# Patient Record
Sex: Female | Born: 1985
Health system: Southern US, Community
[De-identification: ages and names within clinical notes are randomized; demographics above are authoritative.]

## PROBLEM LIST (undated history)

## (undated) DIAGNOSIS — N840 Polyp of corpus uteri: Secondary | ICD-10-CM

## (undated) DIAGNOSIS — O24419 Gestational diabetes mellitus in pregnancy, unspecified control: Secondary | ICD-10-CM

## (undated) DIAGNOSIS — N83201 Unspecified ovarian cyst, right side: Secondary | ICD-10-CM

## (undated) HISTORY — DX: Unspecified ovarian cyst, right side: N83.201

## (undated) HISTORY — DX: Gestational diabetes mellitus in pregnancy, unspecified control: O24.419

---

## 2014-07-18 HISTORY — PX: UTERINE FIBROID SURGERY: SHX826

## 2014-07-18 HISTORY — PX: CYSTECTOMY: SUR359

## 2015-04-13 ENCOUNTER — Encounter: Payer: Self-pay | Admitting: Family Medicine

## 2015-04-13 ENCOUNTER — Ambulatory Visit (INDEPENDENT_AMBULATORY_CARE_PROVIDER_SITE_OTHER): Payer: No Typology Code available for payment source | Admitting: Family Medicine

## 2015-04-13 VITALS — BP 124/81 | HR 63 | Temp 98.2°F | Resp 16 | Ht 64.0 in | Wt 111.0 lb

## 2015-04-13 DIAGNOSIS — Z0000 Encounter for general adult medical examination without abnormal findings: Secondary | ICD-10-CM

## 2015-04-13 DIAGNOSIS — Z7189 Other specified counseling: Secondary | ICD-10-CM

## 2015-04-13 DIAGNOSIS — Z7689 Persons encountering health services in other specified circumstances: Secondary | ICD-10-CM

## 2015-04-13 DIAGNOSIS — R0789 Other chest pain: Secondary | ICD-10-CM | POA: Insufficient documentation

## 2015-04-13 LAB — COMPLETE METABOLIC PANEL WITH GFR
ALT: 16 U/L (ref 6–29)
AST: 22 U/L (ref 10–30)
Albumin: 4.4 g/dL (ref 3.6–5.1)
Alkaline Phosphatase: 37 U/L (ref 33–115)
BILIRUBIN TOTAL: 1.1 mg/dL (ref 0.2–1.2)
BUN: 13 mg/dL (ref 7–25)
CHLORIDE: 107 mmol/L (ref 98–110)
CO2: 27 mmol/L (ref 20–31)
CREATININE: 0.75 mg/dL (ref 0.50–1.10)
Calcium: 9.3 mg/dL (ref 8.6–10.2)
GFR, Est Non African American: >89 mL/min (ref >=60)
GLUCOSE: 93 mg/dL (ref 65–99)
Potassium: 3.7 mmol/L (ref 3.5–5.3)
SODIUM: 139 mmol/L (ref 135–146)
TOTAL PROTEIN: 7 g/dL (ref 6.1–8.1)

## 2015-04-13 LAB — CBC WITH DIFFERENTIAL/PLATELET
BASOS ABS: 0 10*3/uL (ref 0.0–0.1)
Basophils Relative: 0 % (ref 0–1)
EOS ABS: 0 10*3/uL (ref 0.0–0.7)
EOS PCT: 1 % (ref 0–5)
HEMATOCRIT: 38.5 % (ref 36.0–46.0)
Hemoglobin: 12.8 g/dL (ref 12.0–15.0)
LYMPHS ABS: 1.8 10*3/uL (ref 0.7–4.0)
LYMPHS PCT: 42 % (ref 12–46)
MCH: 31.3 pg (ref 26.0–34.0)
MCHC: 33.2 g/dL (ref 30.0–36.0)
MCV: 94.1 fL (ref 78.0–100.0)
MONOS PCT: 10 % (ref 3–12)
MPV: 10.5 fL (ref 8.6–12.4)
Monocytes Absolute: 0.4 10*3/uL (ref 0.1–1.0)
Neutro Abs: 2 10*3/uL (ref 1.7–7.7)
Neutrophils Relative %: 47 % (ref 43–77)
Platelets: 180 10*3/uL (ref 150–400)
RBC: 4.09 MIL/uL (ref 3.87–5.11)
RDW: 13.1 % (ref 11.5–15.5)
WBC: 4.3 10*3/uL (ref 4.0–10.5)

## 2015-04-13 NOTE — Patient Instructions (Signed)
We will call you if there is anything in bloodwork that needs attention. Come back in 3 months. Can take ibupropen for the rib care pain.

## 2015-04-13 NOTE — Progress Notes (Signed)
Patient ID: Kara Hull, female   DOB: 05-14-1986, 29 y.o.   MRN: [id number]   Kara Hull, is a 29 y.o. female  [id number]  [id number]  DOB - [birth year]  CC:  Chief Complaint  Patient presents with  . Establish Care    pain in right side.       HPI: Kara Hull is a 29 y.o. female here to establish care. Patient and husband are recently from [place].  Information is obtained with difficulty even with the help of an interpreter. The patient denies any chronic health problems other than an gynecological issue. She has had surgery for a chocolate cyst and a hysterescopy related to inablility to conceive. I am unclear about results of that procedure.  Her only other compliant is of pain and soreness in the area of the left breast. When she points to the painful area, it is below the breast over the rib cage anteriorly and laterally. She is no aware of an injury. It is unclear how long it has been bothering her.  She takes no medications on a regular basis.  No Known Allergies History reviewed. No pertinent past medical history. No current outpatient prescriptions on file prior to visit.   No current facility-administered medications on file prior to visit.   Family History  Problem Relation Age of Onset  . Diabetes Mother   . Cancer Maternal Grandmother    Social History   Social History  . Marital Status: Married    Spouse Name: N/A  . Number of Children: N/A  . Years of Education: N/A   Occupational History  . Not on file.   Social History Main Topics  . Smoking status: Never Smoker   . Smokeless tobacco: Never Used  . Alcohol Use: No  . Drug Use: No  . Sexual Activity: Not on file   Other Topics Concern  . Not on file   Social History Narrative  . No narrative on file    Review of Systems: Constitutional: Negative for fever, chills, appetite change, weight loss,  Fatigue. Skin: Negative for rashes or lesions of concern. HENT: Negative for ear pain, ear  discharge.nose bleeds Eyes: Negative for pain, discharge, redness, itching and visual disturbance.Wears glasses for vsion. Neck: Negative for pain, stiffness Respiratory: Negative for cough, shortness of breath,   Cardiovascular: Negative for chest pain, palpitations and leg swelling. Gastrointestinal: Negative for abdominal pain, nausea, vomiting, diarrhea, constipations Genitourinary: Negative for dysuria, urgency, frequency, hematuria,  Musculoskeletal: Negative for back pain, joint pain, joint  swelling, and gait problem.Negative for weakness. Neurological: Negative for tremors, seizures, syncope,   light-headedness, numbness and headaches. Positive for occassional dizziness on rising.  GYN: Reports regular menstural periods. No information available as to last PAP. Hematological: Negative for easy bruising or bleeding Psychiatric/Behavioral: Negative for depression, anxiety, decreased concentration, confusion   Objective:   Filed Vitals:   04/13/15 1004  BP: 124/81  Pulse: 63  Temp: 98.2 F (36.8 C)  Resp: 16    Physical Exam: Constitutional: Patient appears well-developed and well-nourished. No distress. HENT: Normocephalic, atraumatic, External right and left ear normal. Oropharynx is clear and moist.  Eyes: Conjunctivae and EOM are normal. PERRLA, no scleral icterus. Neck: Normal ROM. Neck supple. No lymphadenopathy, No thyromegaly. CVS: RRR, S1/S2 +, no murmurs, no gallops, no rubs Pulmonary: Effort and breath sounds normal, no stridor, rhonchi, wheezes, rales.  Abdominal: Soft. Normoactive BS,, no distension, tenderness, rebound or guarding.  Musculoskeletal: Normal range of motion. No edema. There is  tenderness over the rib cage under the left breast and around laterally at the same level.  Neuro: Alert.Normal muscle tone coordination. Non-focal Skin: Skin is warm and dry. No rash noted. Not diaphoretic. No erythema. No pallor. Psychiatric: Normal mood and affect.  Behavior, judgment, thought content normal.  No results found for: WBC, HGB, HCT, MCV, PLT No results found for: CREATININE, BUN, NA, K, CL, CO2  No results found for: HGBA1C Lipid Panel  No results found for: CHOL, TRIG, HDL, CHOLHDL, VLDL, LDLCALC     Assessment and plan:   1. Health care maintenance  - COMPLETE METABOLIC PANEL WITH GFR - CBC with Differential  2. Encounter to establish care - With help of an interpreter, I have reviewed information provided by the patient and her husband  3.Infertility -Will attempt referral.   Return in about 3 months (around 07/13/2015).  The patient was given clear instructions to go to ER or return to medical center if symptoms don't improve, worsen or new problems develop. The patient verbalized understanding.    Henrietta Hoover FNP  04/13/2015, 11:15 AM

## 2015-07-15 ENCOUNTER — Ambulatory Visit (INDEPENDENT_AMBULATORY_CARE_PROVIDER_SITE_OTHER): Payer: Self-pay | Admitting: Family Medicine

## 2015-07-15 ENCOUNTER — Encounter: Payer: Self-pay | Admitting: Family Medicine

## 2015-07-15 ENCOUNTER — Other Ambulatory Visit (HOSPITAL_COMMUNITY)
Admission: RE | Admit: 2015-07-15 | Discharge: 2015-07-15 | Disposition: A | Discharge status: Home / Self Care | Payer: Self-pay | Source: Ambulatory Visit | Attending: Internal Medicine | Admitting: Internal Medicine

## 2015-07-15 VITALS — BP 111/67 | HR 75 | Temp 98.0°F | Resp 16 | Ht 64.0 in | Wt 109.0 lb

## 2015-07-15 DIAGNOSIS — A499 Bacterial infection, unspecified: Secondary | ICD-10-CM

## 2015-07-15 DIAGNOSIS — R10A1 Flank pain, right side: Secondary | ICD-10-CM | POA: Insufficient documentation

## 2015-07-15 DIAGNOSIS — N941 Unspecified dyspareunia: Secondary | ICD-10-CM

## 2015-07-15 DIAGNOSIS — N979 Female infertility, unspecified: Secondary | ICD-10-CM

## 2015-07-15 DIAGNOSIS — Z789 Other specified health status: Secondary | ICD-10-CM

## 2015-07-15 DIAGNOSIS — N760 Acute vaginitis: Secondary | ICD-10-CM

## 2015-07-15 DIAGNOSIS — N898 Other specified noninflammatory disorders of vagina: Secondary | ICD-10-CM

## 2015-07-15 DIAGNOSIS — N83201 Unspecified ovarian cyst, right side: Secondary | ICD-10-CM

## 2015-07-15 DIAGNOSIS — B9689 Other specified bacterial agents as the cause of diseases classified elsewhere: Secondary | ICD-10-CM

## 2015-07-15 DIAGNOSIS — R109 Unspecified abdominal pain: Secondary | ICD-10-CM

## 2015-07-15 DIAGNOSIS — Z01419 Encounter for gynecological examination (general) (routine) without abnormal findings: Secondary | ICD-10-CM

## 2015-07-15 LAB — COMPLETE METABOLIC PANEL WITH GFR
ALBUMIN: 4.4 g/dL (ref 3.6–5.1)
ALK PHOS: 42 U/L (ref 33–115)
ALT: 13 U/L (ref 6–29)
AST: 18 U/L (ref 10–30)
BUN: 8 mg/dL (ref 7–25)
CALCIUM: 9.2 mg/dL (ref 8.6–10.2)
CHLORIDE: 105 mmol/L (ref 98–110)
CO2: 23 mmol/L (ref 20–31)
CREATININE: 0.65 mg/dL (ref 0.50–1.10)
GFR, Est Non African American: >89 mL/min (ref >=60)
Glucose, Bld: 91 mg/dL (ref 65–99)
POTASSIUM: 3.9 mmol/L (ref 3.5–5.3)
Sodium: 138 mmol/L (ref 135–146)
Total Bilirubin: 0.8 mg/dL (ref 0.2–1.2)
Total Protein: 7.2 g/dL (ref 6.1–8.1)

## 2015-07-15 LAB — POCT URINALYSIS DIP (DEVICE)
Bilirubin Urine: NEGATIVE
GLUCOSE, UA: NEGATIVE mg/dL
Hgb urine dipstick: NEGATIVE
KETONES UR: NEGATIVE mg/dL
Leukocytes, UA: NEGATIVE
NITRITE: NEGATIVE
PROTEIN: NEGATIVE mg/dL
Specific Gravity, Urine: 1.02 (ref 1.005–1.030)
Urobilinogen, UA: 0.2 mg/dL (ref 0.0–1.0)
pH: 7 (ref 5.0–8.0)

## 2015-07-15 LAB — CBC WITH DIFFERENTIAL/PLATELET
Basophils Absolute: 0 10*3/uL (ref 0.0–0.1)
Basophils Relative: 0 % (ref 0–1)
Eosinophils Absolute: 0.1 10*3/uL (ref 0.0–0.7)
Eosinophils Relative: 1 % (ref 0–5)
HEMATOCRIT: 39.6 % (ref 36.0–46.0)
Hemoglobin: 13.3 g/dL (ref 12.0–15.0)
LYMPHS ABS: 1.7 10*3/uL (ref 0.7–4.0)
LYMPHS PCT: 31 % (ref 12–46)
MCH: 31.6 pg (ref 26.0–34.0)
MCHC: 33.6 g/dL (ref 30.0–36.0)
MCV: 94.1 fL (ref 78.0–100.0)
MONO ABS: 0.4 10*3/uL (ref 0.1–1.0)
MPV: 10.6 fL (ref 8.6–12.4)
Monocytes Relative: 8 % (ref 3–12)
Neutro Abs: 3.2 10*3/uL (ref 1.7–7.7)
Neutrophils Relative %: 60 % (ref 43–77)
Platelets: 203 10*3/uL (ref 150–400)
RBC: 4.21 MIL/uL (ref 3.87–5.11)
RDW: 13.1 % (ref 11.5–15.5)
WBC: 5.4 10*3/uL (ref 4.0–10.5)

## 2015-07-15 LAB — POCT URINE PREGNANCY: PREG TEST UR: NEGATIVE

## 2015-07-15 MED ORDER — METRONIDAZOLE 500 MG PO TABS: 500.0000 mg | ORAL_TABLET | Freq: Two times a day (BID) | ORAL | Status: DC

## 2015-07-15 NOTE — Progress Notes (Signed)
Subjective:    Patient ID: Kara Hull, female    DOB: May 03, 1986, 29 y.o.   MRN: [id number]  HPI Kara Hull, a 29 year old female with a history of a right ovarian cyst and right cystectomy presents complaining of right flank pain. Patient is accompanied by husband and interpreter, she primarily speaks Mandarin. She recently relocated from [place].  Patient states that she has been experiencing right flank pain and occasional dyspareunia with sexual intercourse. She states that she did not receive follow up after cystectomy, which was performed in [place]. She currently denies fever, fatigue, dysuria, vaginal discharge, abnormal vaginal bleeding, hematuria, nausea, vomiting, or diarrhea. She has not attempted any OTC interventions for right flank pain.   She also maintains that she and her spouse have been trying to conceive for the past 6-8 months without success. She has normal menstrual periods. She has not had a fertility workup. She states that she has not been followed by a gynecologist due to lack of payer source.     Past Medical History  Diagnosis Date  . Right ovarian cyst    Past Surgical History  Procedure Laterality Date  . Cyst    . Cystectomy Right   No Known Allergies   There is no immunization history on file for this patient.  Social History   Social History  . Marital Status: Married    Spouse Name: N/A  . Number of Children: N/A  . Years of Education: N/A   Occupational History  . Not on file.   Social History Main Topics  . Smoking status: Never Smoker   . Smokeless tobacco: Never Used  . Alcohol Use: No  . Drug Use: No  . Sexual Activity: Not on file   Other Topics Concern  . Not on file   Social History Narrative   Review of Systems  Constitutional: Negative.   HENT: Negative.   Eyes: Negative.   Gastrointestinal: Positive for abdominal pain.  Endocrine: Negative.   Genitourinary: Positive for dysuria, vaginal discharge, vaginal pain and  dyspareunia. Negative for hematuria.  Musculoskeletal: Negative.   Skin: Negative.   Allergic/Immunologic: Negative.   Neurological: Negative.   Hematological: Negative.   Psychiatric/Behavioral: Negative.        Objective:   Physical Exam  Constitutional: She is oriented to person, place, and time. She appears well-developed and well-nourished.  HENT:  Head: Normocephalic and atraumatic.  Right Ear: External ear normal.  Left Ear: External ear normal.  Mouth/Throat: Oropharynx is clear and moist.  Eyes: Conjunctivae and EOM are normal. Pupils are equal, round, and reactive to light.  Neck: Normal range of motion. Neck supple.  Cardiovascular: Normal rate, regular rhythm, normal heart sounds and intact distal pulses.   Pulmonary/Chest: Effort normal and breath sounds normal.  Abdominal: Soft. Bowel sounds are normal. Hernia confirmed negative in the right inguinal area and confirmed negative in the left inguinal area.  Genitourinary: Uterus normal. There is no rash or tenderness on the right labia. There is no rash or tenderness on the left labia. Cervix exhibits motion tenderness, discharge and friability. Right adnexum displays no mass, no tenderness and no fullness. Left adnexum displays no mass, no tenderness and no fullness. Vaginal discharge found.  Musculoskeletal: Normal range of motion.  Lymphadenopathy:       Right: No inguinal adenopathy present.       Left: No inguinal adenopathy present.  Neurological: She is alert and oriented to person, place, and time. She has  normal reflexes.  Skin: Skin is warm and dry.  Psychiatric: She has a normal mood and affect. Her behavior is normal. Judgment and thought content normal.         BP 111/67 mmHg  Pulse 75  Temp(Src) 98 F (36.7 C) (Oral)  Resp 16  Ht  (1.626 m)  Wt 109 lb (49.442 kg)  BMI 18.70 kg/m2  LMP 06/15/2015 Assessment & Plan:  1. Unspecified ovarian cyst, right side Patient reports a history of a  right ovarian cyst. She states that she had surgery while living in [place]. Will order pelvic/transvaginal ultrasound. Patient may warrant referral to gynecology for further evaluation.   2. Right flank pain  - POCT urinalysis dipstick (urinalysis unremarkable) - CBC with Differential - COMPLETE METABOLIC PANEL WITH GFR - US Pelvis Complete; Future - US Transvaginal Non-OB; Future - POCT urine pregnancy (negative)  3. Dyspareunia, female Refer to #2  4. Bacterial vaginosis 3-5 clue cells per hight power field. - metroNIDAZOLE (FLAGYL) 500 MG tablet; Take 1 tablet (500 mg total) by mouth 2 (two) times daily.  Dispense: 14 tablet; Refill: 0 -Wetprep/Koh prep 5. Vaginal discharge Copious vaginal discharge on gynecological exam. Many clue cells present on wet prep.   6. Encounter for routine gynecological examination  - Cytology - PAP [place]  7. Language barrier to communication Patient primarily speaks Mandarin, utilized interpreter to assist with communication.   8. Infertility, female Patient has been trying to conceive for greater than 6 months. Patient warrants a gynecology referral for further evaluation, she is currently unable to afford due to lack of payer source.   RTC: Will follow-up with patient by phone following review of ultrasound results.   Massie Maroon, FNP

## 2015-07-15 NOTE — Patient Instructions (Signed)
Bacterial Vaginosis Bacterial vaginosis is a vaginal infection that occurs when the normal balance of bacteria in the vagina is disrupted. It results from an overgrowth of certain bacteria. This is the most common vaginal infection in women of childbearing age. Treatment is important to prevent complications, especially in pregnant women, as it can cause a premature delivery. CAUSES  Bacterial vaginosis is caused by an increase in harmful bacteria that are normally present in smaller amounts in the vagina. Several different kinds of bacteria can cause bacterial vaginosis. However, the reason that the condition develops is not fully understood. RISK FACTORS Certain activities or behaviors can put you at an increased risk of developing bacterial vaginosis, including:  Having a new sex partner or multiple sex partners.  Douching.  Using an intrauterine device (IUD) for contraception. Women do not get bacterial vaginosis from toilet seats, bedding, swimming pools, or contact with objects around them. SIGNS AND SYMPTOMS  Some women with bacterial vaginosis have no signs or symptoms. Common symptoms include:  Grey vaginal discharge.  A fishlike odor with discharge, especially after sexual intercourse.  Itching or burning of the vagina and vulva.  Burning or pain with urination. DIAGNOSIS  Your health care provider will take a medical history and examine the vagina for signs of bacterial vaginosis. A sample of vaginal fluid may be taken. Your health care provider will look at this sample under a microscope to check for bacteria and abnormal cells. A vaginal pH test may also be done.  TREATMENT  Bacterial vaginosis may be treated with antibiotic medicines. These may be given in the form of a pill or a vaginal cream. A second round of antibiotics may be prescribed if the condition comes back after treatment. Because bacterial vaginosis increases your risk for sexually transmitted diseases, getting  treated can help reduce your risk for chlamydia, gonorrhea, HIV, and herpes. HOME CARE INSTRUCTIONS   Only take over-the-counter or prescription medicines as directed by your health care provider.  If antibiotic medicine was prescribed, take it as directed. Make sure you finish it even if you start to feel better.  Tell all sexual partners that you have a vaginal infection. They should see their health care provider and be treated if they have problems, such as a mild rash or itching.  During treatment, it is important that you follow these instructions:  Avoid sexual activity or use condoms correctly.  Do not douche.  Avoid alcohol as directed by your health care provider.  Avoid breastfeeding as directed by your health care provider. SEEK MEDICAL CARE IF:   Your symptoms are not improving after 3 days of treatment.  You have increased discharge or pain.  You have a fever. MAKE SURE YOU:   Understand these instructions.  Will watch your condition.  Will get help right away if you are not doing well or get worse. FOR MORE INFORMATION  Centers for Disease Control and Prevention, Division of STD Prevention: SolutionApps.co.za American Sexual Health Association (ASHA): www.ashastd.org    This information is not intended to replace advice given to you by your health care provider. Make sure you discuss any questions you have with your health care provider.   Document Released: 07/04/2005 Document Revised: 07/25/2014 Document Reviewed: 02/13/2013 Elsevier Interactive Patient Education 2016 Elsevier Inc. Abdominal or Pelvic Ultrasound Ultrasound uses harmless sound waves instead of X-rays to take pictures of the inside of your body. A probe or wand device (transducer) is held up against your body to capture these pictures.  The continually changing images can be recorded on videotape or film. Diagnostic ultrasound imaging is commonly called sonography or ultrasonography. There are  different types of ultrasound exams. An ultrasound of the gallbladder, liver, and pancreas can show gallstones, masses, cysts, inflammation, infection, or enlarged organs. An ultrasound of the kidneys can show cysts, masses, kidney stones, and kidney size and shape. A pelvic ultrasound can show the uterus, ovaries, and cysts or masses. An obstetrical ultrasound shows the position of the fetus, measurements for maturity, fetal heartbeat, and fetal organs. A breast, thyroid, or testicular ultrasound can show if a nodule is solid or cystic. RISKS AND COMPLICATIONS Ultrasound has been used for many years and has never shown any harmful effects. Studies in humans have shown no direct link between the use of ultrasound and adverse outcomes. BEFORE THE PROCEDURE Other than drinking water, do not eat or drink for 8 to 12 hours before the test or as directed by your caregiver. Follow any other diet instructions from your caregiver. If you are having a pelvic ultrasound, you may need to drink a lot of liquid before the exam. A full bladder helps to see the organs behind the bladder better. PROCEDURE  There is no pain in an ultrasound exam. A gel is applied to your skin, and the transducer is then placed on the area to be examined. The gel may feel cool. The gel wipes off easily, but it is a good idea to wear clothing that is easily washable. The images from inside your body are displayed on one or more monitors that look like small television screens. The returning sound waves produce pictures of the organs that were in the path of the sound sent from the transducer. The room is usually darkened during the exam. This makes it easier to see the images on the monitor. The ultrasound exam should take less than 1 hour. AFTER THE PROCEDURE You can safely drive home and return to regular activities immediately after the exam. Ask when your test results will be ready. Make sure you get your test results.   This  information is not intended to replace advice given to you by your health care provider. Make sure you discuss any questions you have with your health care provider.   Document Released: 07/01/2000 Document Revised: 09/26/2011 Document Reviewed: 12/23/2010 Elsevier Interactive Patient Education [employer] Elsevier Inc.

## 2015-07-16 ENCOUNTER — Ambulatory Visit: Payer: Self-pay | Admitting: Family Medicine

## 2015-07-17 LAB — CYTOLOGY - PAP

## 2015-07-22 ENCOUNTER — Ambulatory Visit (HOSPITAL_COMMUNITY): Payer: Self-pay

## 2015-08-04 ENCOUNTER — Ambulatory Visit (HOSPITAL_COMMUNITY)
Admission: RE | Admit: 2015-08-04 | Discharge: 2015-08-04 | Disposition: A | Discharge status: Home / Self Care | Payer: Self-pay | Source: Ambulatory Visit | Attending: Family Medicine | Admitting: Family Medicine

## 2015-08-04 DIAGNOSIS — R109 Unspecified abdominal pain: Secondary | ICD-10-CM | POA: Insufficient documentation

## 2015-08-04 DIAGNOSIS — N838 Other noninflammatory disorders of ovary, fallopian tube and broad ligament: Secondary | ICD-10-CM | POA: Insufficient documentation

## 2015-08-04 DIAGNOSIS — R102 Pelvic and perineal pain: Secondary | ICD-10-CM | POA: Insufficient documentation

## 2015-08-05 ENCOUNTER — Telehealth: Payer: Self-pay | Admitting: Family Medicine

## 2015-08-05 DIAGNOSIS — N83202 Unspecified ovarian cyst, left side: Principal | ICD-10-CM

## 2015-08-05 DIAGNOSIS — R9389 Abnormal findings on diagnostic imaging of other specified body structures: Secondary | ICD-10-CM

## 2015-08-05 DIAGNOSIS — N83201 Unspecified ovarian cyst, right side: Secondary | ICD-10-CM

## 2015-08-05 DIAGNOSIS — N83209 Unspecified ovarian cyst, unspecified side: Secondary | ICD-10-CM | POA: Insufficient documentation

## 2015-08-05 NOTE — Telephone Encounter (Signed)
Called patient using Interpreter ID # [phone] explained to patient results from ultrasound and that she will need to come in for pregnancy test and will need another ultrasound in 6 weeks. Patient verbalized understanding and states will come in today or tomorrow. Thanks!

## 2015-08-05 NOTE — Telephone Encounter (Signed)
Reviewed pelvic/transvaginal ultrasound. Possible hemorrhagic cyst noted in left ovary. Will repeat ultrasound in 6 weeks. Will also repeat pregnancy test to rule out ectopic pregnancy.   Massie Maroon, FNP

## 2015-12-29 ENCOUNTER — Encounter: Payer: Self-pay | Admitting: Pulmonary Disease

## 2015-12-29 ENCOUNTER — Observation Stay (HOSPITAL_COMMUNITY)
Admission: AD | Admit: 2015-12-29 | Discharge: 2015-12-31 | Disposition: A | Discharge status: Home / Self Care | Payer: Self-pay | Source: Ambulatory Visit | Attending: Internal Medicine | Admitting: Internal Medicine

## 2015-12-29 ENCOUNTER — Encounter: Payer: Self-pay | Admitting: Student

## 2015-12-29 ENCOUNTER — Ambulatory Visit (INDEPENDENT_AMBULATORY_CARE_PROVIDER_SITE_OTHER): Payer: Self-pay | Admitting: Pulmonary Disease

## 2015-12-29 ENCOUNTER — Observation Stay (HOSPITAL_COMMUNITY): Payer: Self-pay

## 2015-12-29 VITALS — BP 99/68 | HR 111 | Ht 63.0 in | Wt 119.8 lb

## 2015-12-29 DIAGNOSIS — R748 Abnormal levels of other serum enzymes: Secondary | ICD-10-CM | POA: Diagnosis present

## 2015-12-29 DIAGNOSIS — D696 Thrombocytopenia, unspecified: Secondary | ICD-10-CM | POA: Diagnosis present

## 2015-12-29 DIAGNOSIS — B349 Viral infection, unspecified: Secondary | ICD-10-CM | POA: Insufficient documentation

## 2015-12-29 DIAGNOSIS — R7401 Elevation of levels of liver transaminase levels: Secondary | ICD-10-CM

## 2015-12-29 DIAGNOSIS — R509 Fever, unspecified: Secondary | ICD-10-CM | POA: Diagnosis present

## 2015-12-29 DIAGNOSIS — R059 Cough, unspecified: Secondary | ICD-10-CM

## 2015-12-29 DIAGNOSIS — R823 Hemoglobinuria: Secondary | ICD-10-CM | POA: Insufficient documentation

## 2015-12-29 DIAGNOSIS — R945 Abnormal results of liver function studies: Secondary | ICD-10-CM

## 2015-12-29 DIAGNOSIS — R112 Nausea with vomiting, unspecified: Secondary | ICD-10-CM | POA: Insufficient documentation

## 2015-12-29 DIAGNOSIS — D61818 Other pancytopenia: Principal | ICD-10-CM | POA: Insufficient documentation

## 2015-12-29 DIAGNOSIS — R05 Cough: Secondary | ICD-10-CM

## 2015-12-29 DIAGNOSIS — D72819 Decreased white blood cell count, unspecified: Secondary | ICD-10-CM

## 2015-12-29 DIAGNOSIS — E871 Hypo-osmolality and hyponatremia: Secondary | ICD-10-CM | POA: Insufficient documentation

## 2015-12-29 DIAGNOSIS — N83209 Unspecified ovarian cyst, unspecified side: Secondary | ICD-10-CM

## 2015-12-29 DIAGNOSIS — R946 Abnormal results of thyroid function studies: Secondary | ICD-10-CM | POA: Insufficient documentation

## 2015-12-29 DIAGNOSIS — R740 Nonspecific elevation of levels of transaminase and lactic acid dehydrogenase [LDH]: Secondary | ICD-10-CM | POA: Insufficient documentation

## 2015-12-29 HISTORY — DX: Polyp of corpus uteri: N84.0

## 2015-12-29 LAB — CBC WITH DIFFERENTIAL/PLATELET
BASOS ABS: 0 10*3/uL (ref 0.0–0.1)
Basophils Relative: 0 %
EOS PCT: 1 %
Eosinophils Absolute: 0 10*3/uL (ref 0.0–0.7)
HEMATOCRIT: 36.9 % (ref 36.0–46.0)
Hemoglobin: 13.3 g/dL (ref 12.0–15.0)
LYMPHS ABS: 0.3 10*3/uL — AB (ref 0.7–4.0)
LYMPHS PCT: 7 %
MCH: 30.7 pg (ref 26.0–34.0)
MCHC: 36 g/dL (ref 30.0–36.0)
MCV: 85.2 fL (ref 78.0–100.0)
MONO ABS: 0.1 10*3/uL (ref 0.1–1.0)
Monocytes Relative: 2 %
NEUTROS ABS: 3.7 10*3/uL (ref 1.7–7.7)
Neutrophils Relative %: 90 %
PLATELETS: 50 10*3/uL — AB (ref 150–400)
RBC: 4.33 MIL/uL (ref 3.87–5.11)
RDW: 11.8 % (ref 11.5–15.5)
WBC: 4.2 10*3/uL (ref 4.0–10.5)

## 2015-12-29 LAB — HCG, SERUM, QUALITATIVE: PREG SERUM: NEGATIVE

## 2015-12-29 LAB — COMPREHENSIVE METABOLIC PANEL
ALBUMIN: 2.9 g/dL — AB (ref 3.5–5.0)
ALT: 308 U/L — ABNORMAL HIGH (ref 14–54)
ANION GAP: 8 (ref 5–15)
AST: 403 U/L — AB (ref 15–41)
Alkaline Phosphatase: 98 U/L (ref 38–126)
BUN: 11 mg/dL (ref 6–20)
CHLORIDE: 96 mmol/L — AB (ref 101–111)
CO2: 21 mmol/L — AB (ref 22–32)
Calcium: 7.9 mg/dL — ABNORMAL LOW (ref 8.9–10.3)
Creatinine, Ser: 0.77 mg/dL (ref 0.44–1.00)
GFR calc Af Amer: >60 mL/min (ref >60)
GFR calc non Af Amer: >60 mL/min (ref >60)
GLUCOSE: 129 mg/dL — AB (ref 65–99)
POTASSIUM: 4 mmol/L (ref 3.5–5.1)
SODIUM: 125 mmol/L — AB (ref 135–145)
Total Bilirubin: 0.9 mg/dL (ref 0.3–1.2)
Total Protein: 5.7 g/dL — ABNORMAL LOW (ref 6.5–8.1)

## 2015-12-29 LAB — POCT URINE PREGNANCY: PREG TEST UR: NEGATIVE

## 2015-12-29 LAB — URINALYSIS, ROUTINE W REFLEX MICROSCOPIC
BILIRUBIN URINE: NEGATIVE
GLUCOSE, UA: NEGATIVE mg/dL
KETONES UR: NEGATIVE mg/dL
Leukocytes, UA: NEGATIVE
Nitrite: NEGATIVE
PH: 6.5 (ref 5.0–8.0)
Protein, ur: NEGATIVE mg/dL
Specific Gravity, Urine: 1.004 — ABNORMAL LOW (ref 1.005–1.030)

## 2015-12-29 LAB — URINE MICROSCOPIC-ADD ON: WBC UA: NONE SEEN WBC/hpf (ref 0–5)

## 2015-12-29 LAB — APTT: aPTT: 49 seconds — ABNORMAL HIGH (ref 24–37)

## 2015-12-29 LAB — PROTIME-INR
INR: 1.16 (ref 0.00–1.49)
PROTHROMBIN TIME: 15 s (ref 11.6–15.2)

## 2015-12-29 LAB — ACETAMINOPHEN LEVEL: Acetaminophen (Tylenol), Serum: <10 ug/mL — ABNORMAL LOW (ref 10–30)

## 2015-12-29 LAB — SAVE SMEAR

## 2015-12-29 MED ORDER — PROMETHAZINE HCL 25 MG PO TABS: 12.5000 mg | ORAL_TABLET | Freq: Four times a day (QID) | ORAL | Status: DC | PRN

## 2015-12-29 MED ORDER — DOXYCYCLINE HYCLATE 100 MG PO TABS
100.0000 mg | ORAL_TABLET | Freq: Two times a day (BID) | ORAL | Status: DC
  Administered 2015-12-29 – 2015-12-31 (×4): 100 mg via ORAL
  Filled 2015-12-29 (×4): qty 1

## 2015-12-29 MED ORDER — SODIUM CHLORIDE 0.9 % IV BOLUS (SEPSIS)
1000.0000 mL | Freq: Once | INTRAVENOUS | Status: AC
  Administered 2015-12-29: 1000 mL via INTRAVENOUS

## 2015-12-29 MED ORDER — IBUPROFEN 400 MG PO TABS
400.0000 mg | ORAL_TABLET | Freq: Four times a day (QID) | ORAL | Status: DC | PRN
  Administered 2015-12-29 – 2015-12-31 (×2): 400 mg via ORAL
  Filled 2015-12-29 (×2): qty 1

## 2015-12-29 MED ORDER — POTASSIUM CHLORIDE IN NACL 20-0.9 MEQ/L-% IV SOLN
INTRAVENOUS | Status: AC
  Administered 2015-12-29: 22:00:00 via INTRAVENOUS
  Filled 2015-12-29: qty 1000

## 2015-12-29 NOTE — Assessment & Plan Note (Signed)
Hemorrhagic cyst in (in findings noted on R ovary, in impression noted on L ovary) ovary noted on US 1/17.   Eventually needs repeat US UPT today

## 2015-12-29 NOTE — Assessment & Plan Note (Addendum)
Assessment: AST 374 and ALT 271. Alk phos and t bili normal at 73 and 0.6 respectively. No abdominal pain. Consistent with a hepatocellular pattern. Given the magnitude of or AST ALT elevation would initially think that this may not be viral or toxin related hepatitis or ischemic hepatitis. Though, this may have been early in the course. Other differential would be chronic hepatitis B or C infection, Epstein-Barr virus; cytomegalovirus, other viral infections, HELLP, sepsis.  Plan: Repeat CMP coags Acetaminophen lvl Acute hepatitis serologies

## 2015-12-29 NOTE — Assessment & Plan Note (Addendum)
Assessment: Plt of 39 yesterday. Previously normal (203 in December 2016). Urine preg neg. Differential includes immune thrombocytopenia (especially since no leukopenia or anemia), infection, TTP (although less likely given normal hgb), liver failure.  Plan: Serum HCG as urine HCG can be neg in early pregnancy Repeat CBC HIV, Hep C screen Smear review

## 2015-12-29 NOTE — Progress Notes (Signed)
Kara BradyYanyan Hull is a 30 y.o. female patient admitted from direct admit awake, alert - oriented  X 4 - no acute distress noted.  VSS - Blood pressure 101/71, pulse 99, temperature 99.4 F (37.4 Hull), temperature source Oral, resp. rate 18, last menstrual period 12/29/2015, SpO2 96 %.    IV in place, occlusive dsg intact without redness. Patient speaks Mandarin. Spouse at bedside who speaks english and mandarin.   MD made to make aware that patient arrived to unit and needs orders.   Patient placed on telemetry box 07.   Orientation to room, and floor completed with information packet given to patient/family.  Patient declined safety video at this time.  Admission INP armband ID verified with patient/family, and in place.   SR up x 2, fall assessment complete, with patient and family able to verbalize understanding of risk associated with falls, and verbalized understanding to call nsg before up out of bed.  Call light within reach, patient able to voice, and demonstrate understanding.  Skin, clean-dry- intact without evidence of bruising, or skin tears.   No evidence of skin break down noted on exam.     Will cont to eval and treat per MD orders.  Kara FlackL'ESPERANCE, Kara Bhattacharyya C, RN 12/29/2015 6:23 PM

## 2015-12-29 NOTE — H&P (Signed)
Date: 12/29/2015               Patient Name:  Kara Hull MRN: [id number]  DOB: 25-Nov-1985 Age / Sex: 30 y.o., female   PCP: No primary care provider on file.         Medical Service: Internal Medicine Teaching Service         Attending Physician: Dr. Murriel Hopper, MD    First Contact: Roselyn Meier, MS3 Pager: [phone]  Second Contact: Third Contact: Dr. Zada Finders Dr. Charlott Rakes Pager: Pager: [phone]       After Hours (After 5p/  First Contact Pager: [phone]  weekends / holidays): Second Contact Pager: [phone]   Chief Complaint: Febrile illness  History of Present Illness: [name] is a 30 year old lady with PMH of self-reported endometriosis with chocolate cyst and uterine polyp s/p resection (June 2016) who presented to [facility] to establish care and follow up for fever and abnormal labs at urgent care. She is Mandarin speaking and communication is done through a Optometrist. Her husband is present who provides additional history. 7 days ago she began to have fevers and headache. She later developed muscle aches, fatigue, nausea, vomiting (whenever she ate), cough with scant white/clear sputum production, diarrhea (began 2 days ago), abdominal pain, and night sweats. She developed a non-pruritic rash on her arms last night and husband reports that she has woken in terror overnight as well. She has had nightly fevers, highest of 103F, which she has been treating with alternating Tylenol and Ibuprofen. She was seen at Urgent care on 6/10 and given Tessalon and Promethazine without improvement.  Patient was again seen at Urgent care on 6/12 where she was found to be hypotensive at 53/36 mm Hg and was given 2 L of IVF. She was given IM B12, promethazine, and ondansetron. Lab workup showed +protein on UA, WBC 3.5 and Hgb 11.7 (previously 13 in 12/16), MCV 89, Plts 39. 0.4 abs lymphs, 37% bands. Cr 1.02 (baseline 0.65). Na 127, albumin 3.1. AST 374 and ALT 271. Alk phos  and T bili were normal at 73 and 0.6 respectively. Influenza A and B were negative.  Patient denies any sick contacts, bug or tick bites, or pet exposure. She denies any history of blood transfusion. She has not noticed any swollen lumps or bumps. She moved to the [place] from [place] and was last in [place] from 10/2014 - 01/2015. Patient reports she is currently menstruating, but this is less than usual.   Social Hx: never smoker, no alcohol or illicit drug use   Family Hx: MGM- lung cancer, MGF - DM, mother - DM  Meds: Current Facility-Administered Medications  Medication Dose Route Frequency Provider Last Rate Last Dose  . 0.9 % NaCl with KCl 20 mEq/ L  infusion   Intravenous Continuous Riccardo Dubin, MD      . ibuprofen (ADVIL,MOTRIN) tablet 400 mg  400 mg Oral Q6H PRN Zada Finders, MD      . promethazine (PHENERGAN) tablet 12.5 mg  12.5 mg Oral Q6H PRN Riccardo Dubin, MD      . sodium chloride 0.9 % bolus 1,000 mL  1,000 mL Intravenous Once Zada Finders, MD        Allergies: Allergies as of 12/29/2015  . (No Known Allergies)   Past Medical History  Diagnosis Date  . Right ovarian cyst   . Uterine polyp    Past Surgical History  Procedure Laterality Date  .  Cystectomy Right 2016  . Uterine fibroid surgery  2016    uterine "polyps"   Family History  Problem Relation Age of Onset  . Diabetes Mother   . Cancer Maternal Grandmother     Lung  . Diabetes Mellitus II Maternal Grandfather    Social History   Social History  . Marital Status: Married    Spouse Name: N/A  . Number of Children: N/A  . Years of Education: N/A   Occupational History  . Not on file.   Social History Main Topics  . Smoking status: Never Smoker   . Smokeless tobacco: Never Used  . Alcohol Use: No  . Drug Use: No  . Sexual Activity: Yes   Other Topics Concern  . Not on file   Social History Narrative    Review of Systems: Review of Systems  Constitutional: Positive for fever,  malaise/fatigue and diaphoresis. Negative for chills and weight loss.  HENT: Negative for sore throat.        Bilateral jaw pain at TMJ with chewing  Eyes: Positive for blurred vision.  Respiratory: Positive for cough. Negative for hemoptysis, shortness of breath and wheezing.        Small amount of white/clear sputum production  Cardiovascular: Negative for chest pain, palpitations and leg swelling.  Gastrointestinal: Positive for nausea, vomiting, abdominal pain and diarrhea. Negative for blood in stool.  Genitourinary: Negative for dysuria and hematuria.  Musculoskeletal: Positive for myalgias. Negative for joint pain and falls.  Skin: Positive for rash.       Rash on arms  Neurological: Positive for weakness and headaches. Negative for dizziness and focal weakness.  Psychiatric/Behavioral: Negative for substance abuse.     Physical Exam: Blood pressure 94/51, pulse 112, temperature 100.1 F (37.8 C), temperature source Oral, resp. rate 18, height _0  (1.6 m), last menstrual period 12/29/2015, SpO2 100 %. Physical Exam  Constitutional: She is oriented to person, place, and time.  Thin woman, no distress  HENT:  Head: Normocephalic and atraumatic.  Mouth/Throat: Oropharynx is clear and moist.  Eyes: Conjunctivae and EOM are normal. Pupils are equal, round, and reactive to light. No scleral icterus.  Neck: Normal range of motion. Neck supple. No tracheal deviation present.  Cardiovascular: Normal rate and regular rhythm.  Exam reveals no gallop and no friction rub.   No murmur heard. Pulmonary/Chest: Effort normal. No respiratory distress. She has no wheezes. She has no rales.  Abdominal: Soft. Bowel sounds are normal. She exhibits no distension. There is no hepatosplenomegaly. There is tenderness in the right lower quadrant. There is no rigidity, no guarding, no CVA tenderness and negative Murphy's sign.  Musculoskeletal: Normal range of motion. She exhibits no edema or  tenderness.  Lymphadenopathy:    She has no cervical adenopathy.  Neurological: She is alert and oriented to person, place, and time.  Strength 5/5 b/l upper and lower extremities  Skin: Skin is warm.  Faint mottled rash on bilateral arms, not seen on legs, abdomen, or back.     Lab results: Basic Metabolic Panel:  Recent Labs  12/29/15 1630  NA 125*  K 4.0  CL 96*  CO2 21*  GLUCOSE 129*  BUN 11  CREATININE 0.77  CALCIUM 7.9*   Liver Function Tests:  Recent Labs  12/29/15 1630  AST 403*  ALT 308*  ALKPHOS 98  BILITOT 0.9  PROT 5.7*  ALBUMIN 2.9*   No results for input(s): LIPASE, AMYLASE in the last 72 hours. No results  for input(s): AMMONIA in the last 72 hours. CBC:  Recent Labs  12/29/15 1630  WBC 4.2  NEUTROABS 3.7  HGB 13.3  HCT 36.9  MCV 85.2  PLT 50*   Cardiac Enzymes: No results for input(s): CKTOTAL, CKMB, CKMBINDEX, TROPONINI in the last 72 hours. BNP: No results for input(s): PROBNP in the last 72 hours. D-Dimer: No results for input(s): DDIMER in the last 72 hours. CBG: No results for input(s): GLUCAP in the last 72 hours. Hemoglobin A1C: No results for input(s): HGBA1C in the last 72 hours. Fasting Lipid Panel: No results for input(s): CHOL, HDL, LDLCALC, TRIG, CHOLHDL, LDLDIRECT in the last 72 hours. Thyroid Function Tests: No results for input(s): TSH, T4TOTAL, FREET4, T3FREE, THYROIDAB in the last 72 hours. Anemia Panel: No results for input(s): VITAMINB12, FOLATE, FERRITIN, TIBC, IRON, RETICCTPCT in the last 72 hours. Coagulation:  Recent Labs  12/29/15 1630  LABPROT 15.0  INR 1.16   Urine Drug Screen: Drugs of Abuse  No results found for: LABOPIA, COCAINSCRNUR, LABBENZ, AMPHETMU, THCU, LABBARB  Alcohol Level: No results for input(s): ETH in the last 72 hours. Urinalysis:  Recent Labs  12/29/15 1545  COLORURINE YELLOW  LABSPEC 1.004*  PHURINE 6.5  GLUCOSEU NEGATIVE  HGBUR LARGE*  BILIRUBINUR NEGATIVE    KETONESUR NEGATIVE  PROTEINUR NEGATIVE  NITRITE NEGATIVE  LEUKOCYTESUR NEGATIVE     Imaging results:  Dg Chest 2 View  12/29/2015  CLINICAL DATA:  Nonproductive cough and fever for 5 days, nausea and vomiting for 4 days, diarrhea for 2 days. EXAM: CHEST  2 VIEW COMPARISON:  None. FINDINGS: The heart size and mediastinal contours are within normal limits. Both lungs are clear. The visualized skeletal structures are unremarkable. IMPRESSION: No active cardiopulmonary disease. Electronically Signed   By: Franki Cabot M.D.   On: 12/29/2015 20:03    Other results: EKG: n/a.  Assessment & Plan by Problem: Active Problems:   Abnormal liver enzymes   Thrombocytopenia (HCC)   Fever, unspecified   30 year old lady with PMH of self-reported endometriosis with chocolate cyst and uterine polyp s/p resection (June 2016) who presents with fevers, night sweats, myalgias, fatigue, thrombocytopenia, and elevated LFTs.   Febrile Illness with Thrombocytopenia: Platelets found to be 50,000 (203,000 in 12/16).APTT prolonged to 49. Pregnancy test negative. Etiology could be from infection (HIV, hepatitis, EBV), immune related (ITP, SLE), drug related (tylenol, advil), and thrombotic microangiopathy, inherited platelet disorders. The most likely cause at this time is infectious given the recent viral prodrome. ITP and SLE are possible given her young age and are sometimes brought on by recent infections. However, these are less likely because they are usually not associated with transaminitis. Tylenol has been shown to cause thrombocytopenia, but this is less likely given she has only been taking 1000 mg every 6 hours. Thrombotic microangiopathy is less likely without associated anemia. Would also consider [facility] Spotted Fever with her constellation of symptoms. - GI pathogen panel - Viral Hepatitis panel - HIV testing - ANA - TSH - RMSF - CBC tomorrow morning with smear  Elevated  transaminases: AST 403, ALT 308, albumin 2.9, alk phos 98, TB 0.9, total protein elevated at 5.7. Possible etiologies include infection (hepatitis, EBV), autoimmune hepatitis, drug induced, Wilson disease, and alcoholic hepatitis. Suspect infectious cause at this time given her recent illness and associated symptoms. - Abdominal U/S  - f/u Viral hepatitis panel - CMP in AM   Hyponatremia: Na 125. Most likely from recent malnutrition secondary to inability  to tolerate oral intake.  - IV normal saline 75 mL/hr normal saline with 20 KCl for 12 hours - Recheck BMP tomorrow morning  Hemoglobinuria: Large amounts of hemoglobin on UA. 0-5 RBCs/hpf.  Diet: Clears  DVT ppx: SCDs  Code: FULL   Dispo: Disposition is deferred at this time, awaiting improvement of current medical problems. Anticipated discharge in approximately 2-4 day(s).   The patient does not have a current PCP (No primary care provider on file.) and does need an [facility] hospital follow-up appointment after discharge.  The patient does not have transportation limitations that hinder transportation to clinic appointments.  Signed: Zada Finders, MD 12/29/2015, 8:48 PM

## 2015-12-29 NOTE — Assessment & Plan Note (Addendum)
Overview: Presenting with 7 days of fever, headache, dry cough, N/V, diarrhea, myalgias. She has not been able to keep down food and only has been drinking a little water. +protein on UA. WBC 3.5 and Hgb 11.7, MCV 89 - normal. Plts 39. 0.4 abs lymphs, 37% bands. Cr 1.02 (baseline 0.65). Na 127, albumin 3.1. AST 374 and ALT 271. Alk phos and t bili normal at 73 and 0.6 respectively. Flu neg.  Assessment: Differential includes viral versus bacterial infection. Given that she is feeling so weak and has not been able to tolerate oral intake, needs admission.  Plan: Admit to obs, med-surg Normal saline bolus CBC, CMP, UA HIV screen

## 2015-12-29 NOTE — H&P (Signed)
Date: 12/29/2015               Patient Name:  Kara Hull MRN: [id number]  DOB: 1986-03-06 Age / Sex: 30 y.o., female   PCP: No primary care provider on file.              Medical Service: Internal Medicine Teaching Service              Attending Physician: Dr. Aldine Contes, MD    First Contact: Roselyn Meier, MS 3 Pager: [phone]  Second Contact: Dr. Zada Finders Pager: [phone]  Third Contact Dr. Charlott Rakes Pager: [phone]       After Hours (After 5p/  First Contact Pager: [phone]  weekends / holidays): Second Contact Pager: [phone]   Chief Complaint: Fever, fatigue, nausea, vomiting, diarrhea  History of Present Illness:  Kara Hull is a 30 year-old female with a history of uterine polyps and ovarian endometriosis who presents with fever, headache, cough, nausea, vomiting, and diarrhea. Patient doesn't speak English, so the history is obtained with the help of a Press photographer and her husband, who speaks some [place].  Patient was in her usual state of health until 7 days ago when she developed a fever and headache.  She has been taking tylenol 1000 mg every six hours for her temperature which lowers it.  Husband reports a Tmax of 103.  Patient endorses generalized fatigue, myalgias, and night sweats since then but denies recent weight changes and jaundice.  Two days later she developed a productive cough with clear mucous in addition to nausea and vomiting.  Patient reports being unable to tolerate food intake without vomiting. She was seen at [facility] urgent care on 6/10 and was given tessalon and promethazine without improvement.  The next day she developed diarrhea and bloating but denied abdominal pain, melena, hematochezia, and hematemesis.  She also denies chest pain, shortness of breath, and syncope.    Patient returned to urgent care on 6/12 where she was found to be hypotensive to 53/36 mm Hg and was given 2 L of IVF.  In addition she was given IM B12, promethazine, and  ondansetron.  Lab workup showed +protein on UA, WBC 3.5 and Hgb 11.7 (previously 13 in 12/16), MCV 89, Plts 39. 0.4 abs lymphs, 37% bands. Cr 1.02 (baseline 0.65). Na 127, albumin 3.1. AST 374 and ALT 271. Alk phos and t bili normal at 73 and 0.6 respectively. Flu neg.  She developed a rash on her both of her arms last night, but denies associated itching and pain.  Patient hasn't noticed any enlarged or tender lymph nodes.  She denies recent sick contacts, pet exposure, IV drug use, and bug bites.  Patient was last in [place] from 10/2014-01/2015.  She is currently menstruating but claims her bleeding has been less than usual.     Meds: No current facility-administered medications for this encounter.   Current Outpatient Prescriptions  Medication Sig Dispense Refill  . dicyclomine (BENTYL) 10 MG capsule Take 10 mg by mouth every 6 (six) hours.    . ondansetron (ZOFRAN) 4 MG tablet Take 4 mg by mouth every 4 (four) hours.    . promethazine (PHENERGAN) 25 MG tablet Take 25 mg by mouth every 6 (six) hours as needed for nausea or vomiting.    . metroNIDAZOLE (FLAGYL) 500 MG tablet Take 1 tablet (500 mg total) by mouth 2 (two) times daily. 14 tablet 0    Allergies: Allergies as of 12/29/2015  . (No  Known Allergies)   Past Medical History  Diagnosis Date  . Right ovarian cyst   . Uterine polyp    Past Surgical History  Procedure Laterality Date  . Cystectomy Right 2016  . Uterine fibroid surgery  2016    uterine "polyps"   Family History  Problem Relation Age of Onset  . Diabetes Mother   . Cancer Maternal Grandmother     Lung  . Diabetes Mellitus II Maternal Grandfather    Social History   Social History  . Marital Status: Married    Spouse Name: N/A  . Number of Children: N/A  . Years of Education: N/A   Occupational History  . Not on file.   Social History Main Topics  . Smoking status: Never Smoker   . Smokeless tobacco: Never Used  . Alcohol Use: No  . Drug Use: No  .  Sexual Activity: Yes   Other Topics Concern  . Not on file   Social History Narrative    Review of Systems: Constitutional: per HPI.   Eyes: No vision changes Ears, nose, mouth, throat, and face: Per HPI.  Negative for sorethroat, nasal congestion, ear pain Respiratory: per HPI.  Negative for shortness of breath, wheezing Cardiovascular: negative for chest pressure/discomfort Gastrointestinal: per HPI.   Genitourinary:Negative for dysuria, hematuria, frequency Integument/breast: per HPI Hematologic/lymphatic: negative for lymphadenopathy Musculoskeletal:positive for myalgias Neurological: negative for dizziness, memory problems, seizures and weakness Behavioral/Psych: negative for Negative for mood changes Endocrine: negative for Negative for polyuria Allergic/Immunologic: negative for angiodedema  Physical Exam: Last menstrual period 12/29/2015. General appearance: cooperative, appears stated age and no distress Head: Normocephalic, without obvious abnormality, atraumatic Eyes: negative findings: conjunctivae and sclerae normal, pupils equal, round, reactive to light and accomodation and anicteric sclera.  Extra ocular movements intact Nose: no discharge Throat: normal findings: buccal mucosa normal and tongue midline and normal and moist mucosa Neck: no adenopathy, supple, symmetrical, trachea midline and thyroid not enlarged, symmetric, no tenderness/mass/nodules Back: No CVA tenderness Lungs: clear to auscultation bilaterally and normal work of breathing.  No crackles or wheezes Heart: regular rate and rhythm, S1, S2 normal, no murmur, click, rub or gallop Abdomen: soft, non-tender; bowel sounds normal; no masses,  no organomegaly Extremities: extremities normal, atraumatic, no cyanosis or edema Pulses: 2+ and symmetric Skin: Blanchable mottled erythematous macules on the bilateral forearms and hands.  Lymph nodes: Cervical, supraclavicular, and axillary nodes  normal. Neurologic: Alert and oriented X 3, normal strength and tone. Normal symmetric reflexes. Normal coordination and gait Sensory: normal  Lab Results:  Recent Results (from the past 2160 hour(s))  Urinalysis, Reflex Microscopic     Status: Abnormal   Collection Time: 12/29/15  3:45 PM  Result Value Ref Range   Color, Urine YELLOW YELLOW   APPearance CLOUDY (A) CLEAR   Specific Gravity, Urine 1.004 (L) 1.005 - 1.030   pH 6.5 5.0 - 8.0   Glucose, UA NEGATIVE NEGATIVE mg/dL   Hgb urine dipstick LARGE (A) NEGATIVE   Bilirubin Urine NEGATIVE NEGATIVE   Ketones, ur NEGATIVE NEGATIVE mg/dL   Protein, ur NEGATIVE NEGATIVE mg/dL   Nitrite NEGATIVE NEGATIVE   Leukocytes, UA NEGATIVE NEGATIVE  Urine microscopic-add on     Status: Abnormal   Collection Time: 12/29/15  3:45 PM  Result Value Ref Range   Squamous Epithelial / LPF 0-5 (A) NONE SEEN   WBC, UA NONE SEEN 0 - 5 WBC/hpf   RBC / HPF 0-5 0 - 5 RBC/hpf   Bacteria, UA  FEW (A) NONE SEEN  POCT Urine Pregnancy     Status: None   Collection Time: 12/29/15  3:58 PM  Result Value Ref Range   Preg Test, Ur Negative Negative  Protime-INR     Status: None   Collection Time: 12/29/15  4:30 PM  Result Value Ref Range   Prothrombin Time 15.0 11.6 - 15.2 seconds   INR 1.16 0.00 - 1.49  APTT     Status: Abnormal   Collection Time: 12/29/15  4:30 PM  Result Value Ref Range   aPTT 49 (H) 24 - 37 seconds    Comment:        IF BASELINE aPTT IS ELEVATED, SUGGEST PATIENT RISK ASSESSMENT BE USED TO DETERMINE APPROPRIATE ANTICOAGULANT THERAPY.   CMP w Anion Gap (STAT/Sunquest-performed on-site)     Status: Abnormal   Collection Time: 12/29/15  4:30 PM  Result Value Ref Range   Sodium 125 (L) 135 - 145 mmol/L   Potassium 4.0 3.5 - 5.1 mmol/L   Chloride 96 (L) 101 - 111 mmol/L   CO2 21 (L) 22 - 32 mmol/L   Glucose, Bld 129 (H) 65 - 99 mg/dL   BUN 11 6 - 20 mg/dL   Creatinine, Ser 0.77 0.44 - 1.00 mg/dL   Calcium 7.9 (L) 8.9 - 10.3 mg/dL    Total Protein 5.7 (L) 6.5 - 8.1 g/dL   Albumin 2.9 (L) 3.5 - 5.0 g/dL   AST 403 (H) 15 - 41 U/L   ALT 308 (H) 14 - 54 U/L   Alkaline Phosphatase 98 38 - 126 U/L   Total Bilirubin 0.9 0.3 - 1.2 mg/dL   GFR calc non Af Amer >60 >60 mL/min   GFR calc Af Amer >60 >60 mL/min    Comment: (NOTE) The eGFR has been calculated using the CKD EPI equation. This calculation has not been validated in all clinical situations. eGFR's persistently <60 mL/min signify possible Chronic Kidney Disease.    Anion gap 8 5 - 15  CBC with Diff     Status: Abnormal (Preliminary result)   Collection Time: 12/29/15  4:30 PM  Result Value Ref Range   WBC 4.2 4.0 - 10.5 K/uL   RBC 4.33 3.87 - 5.11 MIL/uL   Hemoglobin 13.3 12.0 - 15.0 g/dL   HCT 36.9 36.0 - 46.0 %   MCV 85.2 78.0 - 100.0 fL   MCH 30.7 26.0 - 34.0 pg   MCHC 36.0 30.0 - 36.0 g/dL   RDW 11.8 11.5 - 15.5 %   Platelets PENDING 150 - 400 K/uL   Neutrophils Relative % 90 %   Neutro Abs 3.7 1.7 - 7.7 K/uL   Lymphocytes Relative 7 %   Lymphs Abs 0.3 (L) 0.7 - 4.0 K/uL   Monocytes Relative 2 %   Monocytes Absolute 0.1 0.1 - 1.0 K/uL   Eosinophils Relative 1 %   Eosinophils Absolute 0.0 0.0 - 0.7 K/uL   Basophils Relative 0 %   Basophils Absolute 0.0 0.0 - 0.1 K/uL  Acetaminophen level     Status: Abnormal   Collection Time: 12/29/15  4:30 PM  Result Value Ref Range   Acetaminophen (Tylenol), Serum <10 (L) 10 - 30 ug/mL    Comment:        THERAPEUTIC CONCENTRATIONS VARY SIGNIFICANTLY. A RANGE OF 10-30 ug/mL MAY BE AN EFFECTIVE CONCENTRATION FOR MANY PATIENTS. HOWEVER, SOME ARE BEST TREATED AT CONCENTRATIONS OUTSIDE THIS RANGE. ACETAMINOPHEN CONCENTRATIONS >150 ug/mL AT 4 HOURS AFTER INGESTION AND >50  ug/mL AT 12 HOURS AFTER INGESTION ARE OFTEN ASSOCIATED WITH TOXIC REACTIONS.   Pregnancy Test, Serum, Qual     Status: None   Collection Time: 12/29/15  4:30 PM  Result Value Ref Range   Preg, Serum NEGATIVE NEGATIVE    Comment:         THE SENSITIVITY OF THIS METHODOLOGY IS >10 mIU/mL.     Assessment & Plan by Problem: Active Problems:   Thrombocytopenia    Elevated transaminases    Hyponatremia    Hemoglobinuria  Ms. Margulies is a 30 year-old female with a history of endometriosis and uterine polyps who presents with a 7 day history of fever, fatigue, myalgias, cough, nausea, vomiting, and diarrhea.  Her exam was essentially unremarkable but labs were significant for thrombocytopenia with a prolonged aPTT, elevated transaminases, hyponatremia, and large amounts of hemoglobin in her urine.    #Thrombocytopenia: Platelets found to be 50,000 (203,000 in 12/16).    APTT prolonged to 49.  Pregnancy test negative.  Etiology could be from infection (HIV, hepatitis, EBV), immune related (ITP, SLE), drug related (tylenol, advil), and thrombotic microangiopathy, inherited platelet disorders.  The most likely cause at this time is infectious given the recent viral prodrome.  ITP and SLE are possible given her young age and are sometimes brought on by recent infections.  However, these are less likely because they are usually not associated with transaminitis.  Tylenol has been shown to cause thrombocytopenia, but this is less likely given she has only been taking 1000 mg every 6 hours.  Thrombotic microangiopathy is less likely without associated anemia.   - GI pathogen panel - Viral Hepatitis panel - HIV testing - ANA - TSH - CBC tomorrow morning with smear  #Elevated transaminases:  AST 403, ALT 308, albumin 2.9, alk phos 98, TB 0.9, total protein elevated at 5.7.  Possible etiologies include infection (hepatitis, EBV), autoimmune hepatitis, drug induced, Wilson disease, and alcoholic hepatitis.  Suspect infectious cause at this time given her recent illness and associated symptoms.   - Viral hepatitis panel  #Nausea/vomiting - phenergan 12.5 mg PO  #Hyponatremia: Na 125.  Most likely from recent malnutrition secondary to  inability to tolerate oral intake.   - IV normal saline 100 mL/hr normal saline with 20 KCl given vomiting - Recheck BMP tomorrow morning  #Hemoglobinuria:  Large amounts of hemoglobin on UA.  0-5 RBCs/hpf.  Likely from myoglobin.    This is a Careers information officer Note.  The care of the patient was discussed with Dr. Posey Pronto and the assessment and plan was formulated with their assistance.  Please see their note for official documentation of the patient encounter.   Signed: Jobie Quaker, Med Student 12/29/2015, 5:13 PM

## 2015-12-29 NOTE — Progress Notes (Signed)
   Subjective:    Patient ID: Kara Hull, female    DOB: 09-05-1985, 30 y.o.   MRN: [id number]  HPI Kara Hull is a 30 year old woman with history of ovarian cyst presenting to follow up fever, abnormal labs and establish care at [facility].  Previously was seen at [facility].  Started getting sick 7 days ago. No sick contacts. Started with headache and a little fever. The fever worsened. She then developed cough, nausea, and vomit. Cough is nonproductive. Diaphoresis and some night sweats. Brief blurry vision and had presyncope yesterday. No abdominal pain but does feel bloated. Having severe watery diarrhea x 2 days. Facial palor. No jaundice. Had an episode of waking up yesterday and felt immense terror. She noted that her period has been lighter than it usually has been in the past.   Never been sick like this before. No abnormal foods. Came to the [place] in 2015 from [place]. No TB exposure.   Was seen at [facility] urgent care 12/28/2015 for follow up of cough. Was also having fever, N/V. She was given IV fluids, vitamin b12 and promethazine/ondansetron. +protein on UA. WBC 3.5 and Hgb 11.7, MCV 89 - normal. Plts 39. 0.4 abs lymphs, 37% bands. Cr 1.02 (baseline 0.65). Na 127, albumin 3.1. AST 374 and ALT 271. Alk phos and t bili normal at 73 and 0.6 respectively.  Not currently working. Her husband is an Network engineer.  Review of Systems Eyes: no vision changes Respiratory: no shortness of breath Cardiovascular: +chest pain with cough Gastrointestinal: no abdominal pain, no constipation, +diarrhea Genitourinary: no dysuria, no hematuria Integument: mottled forearms Hematologic/lymphatic: no bleeding/bruising, +facial edema, a little edema in hands and feet Musculoskeletal: no arthralgias, +myalgias Neurological: no paresthesias, +generalized weakness  Past Medical History  Diagnosis Date  . Right ovarian cyst     Current Outpatient Prescriptions  on File Prior to Visit  Medication Sig Dispense Refill  . metroNIDAZOLE (FLAGYL) 500 MG tablet Take 1 tablet (500 mg total) by mouth 2 (two) times daily. 14 tablet 0   No current facility-administered medications on file prior to visit.      Objective:   Physical Exam Height _0  (1.6 m), weight 119 lb 12.8 oz (54.341 kg), last menstrual period 12/29/2015. General Apperance: NAD HEENT: Normocephalic, atraumatic, anicteric sclera Neck: Supple, trachea midline Lungs: Clear to auscultation bilaterally. No wheezes, rhonchi or rales. Breathing comfortably Heart: Regular rate and rhythm, no murmur/rub/gallop Abdomen: Soft, nontender, nondistended, no rebound/guarding Extremities: Warm and well perfused, no edema Skin: No rashes or lesions Neurologic: Alert and interactive. No gross deficits.    Assessment & Plan:  Please refer to problem based charting.

## 2015-12-30 ENCOUNTER — Encounter (HOSPITAL_COMMUNITY): Payer: Self-pay

## 2015-12-30 ENCOUNTER — Observation Stay (HOSPITAL_COMMUNITY): Payer: Self-pay

## 2015-12-30 DIAGNOSIS — D61818 Other pancytopenia: Secondary | ICD-10-CM

## 2015-12-30 LAB — COMPREHENSIVE METABOLIC PANEL
ALBUMIN: 2.1 g/dL — AB (ref 3.5–5.0)
ALT: 249 U/L — AB (ref 14–54)
AST: 323 U/L — AB (ref 15–41)
Alkaline Phosphatase: 73 U/L (ref 38–126)
Anion gap: 3 — ABNORMAL LOW (ref 5–15)
BUN: 11 mg/dL (ref 6–20)
CHLORIDE: 108 mmol/L (ref 101–111)
CO2: 21 mmol/L — AB (ref 22–32)
CREATININE: 0.8 mg/dL (ref 0.44–1.00)
Calcium: 7 mg/dL — ABNORMAL LOW (ref 8.9–10.3)
GFR calc non Af Amer: >60 mL/min (ref >60)
Glucose, Bld: 139 mg/dL — ABNORMAL HIGH (ref 65–99)
Potassium: 4.3 mmol/L (ref 3.5–5.1)
SODIUM: 132 mmol/L — AB (ref 135–145)
Total Bilirubin: 0.6 mg/dL (ref 0.3–1.2)
Total Protein: 4.3 g/dL — ABNORMAL LOW (ref 6.5–8.1)

## 2015-12-30 LAB — CBC
HCT: 30.5 % — ABNORMAL LOW (ref 36.0–46.0)
Hemoglobin: 10.8 g/dL — ABNORMAL LOW (ref 12.0–15.0)
MCH: 30.7 pg (ref 26.0–34.0)
MCHC: 35.4 g/dL (ref 30.0–36.0)
MCV: 86.6 fL (ref 78.0–100.0)
PLATELETS: 48 10*3/uL — AB (ref 150–400)
RBC: 3.52 MIL/uL — AB (ref 3.87–5.11)
RDW: 12 % (ref 11.5–15.5)
WBC: 3.9 10*3/uL — AB (ref 4.0–10.5)

## 2015-12-30 LAB — GASTROINTESTINAL PANEL BY PCR, STOOL (REPLACES STOOL CULTURE)
Adenovirus F40/41: NOT DETECTED
Astrovirus: NOT DETECTED
CYCLOSPORA CAYETANENSIS: NOT DETECTED
Campylobacter species: NOT DETECTED
Cryptosporidium: NOT DETECTED
E. COLI O157: NOT DETECTED
ENTEROAGGREGATIVE E COLI (EAEC): NOT DETECTED
Entamoeba histolytica: NOT DETECTED
Enteropathogenic E coli (EPEC): NOT DETECTED
Enterotoxigenic E coli (ETEC): NOT DETECTED
Giardia lamblia: NOT DETECTED
NOROVIRUS GI/GII: NOT DETECTED
PLESIMONAS SHIGELLOIDES: NOT DETECTED
ROTAVIRUS A: NOT DETECTED
SALMONELLA SPECIES: NOT DETECTED
SAPOVIRUS (I, II, IV, AND V): NOT DETECTED
SHIGA LIKE TOXIN PRODUCING E COLI (STEC): NOT DETECTED
Shigella/Enteroinvasive E coli (EIEC): NOT DETECTED
Vibrio cholerae: NOT DETECTED
Vibrio species: NOT DETECTED
Yersinia enterocolitica: NOT DETECTED

## 2015-12-30 LAB — DIFFERENTIAL
BASOS ABS: 0 10*3/uL (ref 0.0–0.1)
BASOS PCT: 0 %
EOS ABS: 0 10*3/uL (ref 0.0–0.7)
EOS PCT: 1 %
LYMPHS ABS: 0.5 10*3/uL — AB (ref 0.7–4.0)
Lymphocytes Relative: 12 %
Monocytes Absolute: 0.3 10*3/uL (ref 0.1–1.0)
Monocytes Relative: 7 %
NEUTROS PCT: 80 %
Neutro Abs: 3.1 10*3/uL (ref 1.7–7.7)

## 2015-12-30 LAB — FIBRINOGEN: Fibrinogen: 207 mg/dL (ref 204–475)

## 2015-12-30 LAB — HIV ANTIBODY (ROUTINE TESTING W REFLEX): HIV Screen 4th Generation wRfx: NONREACTIVE

## 2015-12-30 LAB — LACTATE DEHYDROGENASE: LDH: 426 U/L — ABNORMAL HIGH (ref 98–192)

## 2015-12-30 LAB — ANTINUCLEAR ANTIBODIES, IFA: ANTINUCLEAR ANTIBODIES, IFA: NEGATIVE

## 2015-12-30 LAB — D-DIMER, QUANTITATIVE (NOT AT ARMC): D DIMER QUANT: 5.34 ug{FEU}/mL — AB (ref 0.00–0.50)

## 2015-12-30 LAB — HEPATITIS B SURFACE ANTIGEN: HEP B S AG: NEGATIVE

## 2015-12-30 LAB — HEPATITIS A ANTIBODY, IGM: HEP A IGM: NEGATIVE

## 2015-12-30 LAB — HEPATITIS B CORE ANTIBODY, IGM: Hep B C IgM: NEGATIVE

## 2015-12-30 LAB — DIRECT ANTIGLOBULIN TEST (NOT AT ARMC)
DAT, COMPLEMENT: NEGATIVE
DAT, IGG: NEGATIVE

## 2015-12-30 LAB — HEPATITIS C ANTIBODY: HCV Ab: <0.1 s/co ratio (ref 0.0–0.9)

## 2015-12-30 LAB — TSH: TSH: 4.79 u[IU]/mL — AB (ref 0.350–4.500)

## 2015-12-30 MED ORDER — POTASSIUM CHLORIDE IN NACL 20-0.9 MEQ/L-% IV SOLN
INTRAVENOUS | Status: AC
  Administered 2015-12-30: 11:00:00 via INTRAVENOUS
  Filled 2015-12-30 (×2): qty 1000

## 2015-12-30 MED ORDER — POTASSIUM CHLORIDE IN NACL 20-0.9 MEQ/L-% IV SOLN
INTRAVENOUS | Status: DC
  Filled 2015-12-30 (×2): qty 1000

## 2015-12-30 NOTE — Progress Notes (Signed)
Subjective: Patient seen this morning, husband is present. She had a fever of 102.1 overnight, afebrile this morning. She reports feeling somewhat better this morning, but did have 2 loose bowel movements overnight as well. She was started on oral Doxycycline last night.  Objective: Vital signs in last 24 hours: Filed Vitals:   12/29/15 2333 12/30/15 0315 12/30/15 0548 12/30/15 0622  BP: 94/44  93/47   Pulse: 97  72   Temp: 99.3 F (37.4 C) 98.2 F (36.8 C)  98 F (36.7 C)  TempSrc:  Oral  Oral  Resp: 18  14   Height:      SpO2: 97%  100%    Weight change:   Intake/Output Summary (Last 24 hours) at 12/30/15 1144 Last data filed at 12/30/15 1610  Gross per 24 hour  Intake   1000 ml  Output    850 ml  Net    150 ml   General: resting in bed, no acute distress Cardiac: RRR, no rubs, murmurs or gallops Pulm: clear to auscultation bilaterally, moving normal volumes of air Abd: soft, nontender, nondistended, BS present Ext: warm and well perfused, no pedal edema Skin: faint macular rash on abdomen, no longer has mottled rash on forearms   Assessment/Plan: Active Problems:   Abnormal liver enzymes   Thrombocytopenia (HCC)   Fever, unspecified  Febrile Illness with Pancytopenia: Platelets found to be 50,000 (203,000 in 12/16).APTT prolonged to 49. Pregnancy test negative. Etiology could be from infection (HIV, hepatitis, EBV), immune related (ITP, SLE), drug related (tylenol, advil), and thrombotic microangiopathy, inherited platelet disorders. The most likely cause at this time is infectious given the recent viral prodrome. ITP and SLE are possible given her young age and are sometimes brought on by recent infections. However, these are less likely because they are usually not associated with transaminitis. Thrombotic microangiopathy is less likely without associated anemia. Would also consider tick-borne illnesses (RMSF, aplasmocytosis) with her constellation of  symptoms. D-dimer is elevated at 5.34, fibrinogen is low normal at 207. Direct antiglobulin is negative. She was started on empiric Doxycycline last night. Would also consider Adult Onset of Still's Disease as possible cause for patient's symptoms. - Continue oral Doxycycline 100 mg BID - GI pathogen panel pending - Hep A antibody, Hep B surface antigen, Hep B core antibody IgM, and Hep C Ab are negative - HIV is negative - ANA is pending - TSH is slightly elevated at 4.790 - RMSF and Borrelia burgdorfi antibodies pending - Will review smear - f/u blood cultures - LDH elevated at 426 -Ibuprofen 400 mg q6h prn fever  Elevated transaminases: AST 403, ALT 308, albumin 2.9, alk phos 98, TB 0.9, total protein elevated at 5.7 on admission. Viral hepatitis work up is negative as above. Differentials still include EBV, autoimmune hepatitis, drug induced, and Wilson disease. Suspect infectious cause at this time given her recent illness and associated symptoms. LFTs are downtrending this morning - f/u Abdominal U/S  - f/u EBV studies - Monitor CMP   Hyponatremia: Na 125 >> 132. Most likely from recent malnutrition secondary to inability to tolerate oral intake.  - IV normal saline 100 mL/hr normal saline with 20 KCl for 8 hours - follow BMP  Diet: Advance as tolerated after Abdominal U/S  Dispo: Disposition is deferred at this time, awaiting improvement of current medical problems.  Anticipated discharge in approximately 1-3 day(s).   The patient does not have a current PCP (No primary care provider on file.) and does  need an [facility] hospital follow-up appointment after discharge.  The patient does not have transportation limitations that hinder transportation to clinic appointments.      Zada Finders, MD 12/30/2015, 11:44 AM

## 2015-12-30 NOTE — Progress Notes (Signed)
Subjective: No acute events overnight.  Patient reports feeling better this morning.  She spiked a fever to 102.1 overnight, but was afebrile this morning.  She reports 2 episodes of loose stools overnight.  Denies nausea and vomiting.  She still endorses a cough.  No other complaints.    Objective: Vital signs in last 24 hours: Filed Vitals:   12/29/15 2333 12/30/15 0315 12/30/15 0548 12/30/15 0622  BP: 94/44  93/47   Pulse: 97  72   Temp: 99.3 F (37.4 C) 98.2 F (36.8 C)  98 F (36.7 C)  TempSrc:  Oral  Oral  Resp: 18  14   Height:      SpO2: 97%  100%    Weight change:   Intake/Output Summary (Last 24 hours) at 12/30/15 1058 Last data filed at 12/30/15 2841  Gross per 24 hour  Intake   1000 ml  Output    850 ml  Net    150 ml   Physical Exam: General:  Comfortable appearing.  Resting in bed in no acute distress HEENT:  Anicteric sclera CV:  Regular rate and rhythm.  Normal S1 and S2.  No murmurs.  Lungs:  Normal work of breathing.  Clear to auscultation bilaterally without crackles or wheezes. Abdomen: soft, nontender, and nondistended.  Normal bowel sounds.  No hepatosplenomegaly.   Ext: no peripheral edema. Pulses 2+ throughout Skin:  Faint mottled macular rash on the abdomen, no longer present on the arms.   Lab Results:  Recent Labs Lab 12/29/15 1630 12/30/15 0538  WBC 4.2 3.9*  HGB 13.3 10.8*  HCT 36.9 30.5*  PLT 50* 48*    Recent Labs Lab 12/29/15 1630 12/30/15 0537  NA 125* 132*  K 4.0 4.3  CL 96* 108  CO2 21* 21*  BUN 11 11  CREATININE 0.77 0.80  CALCIUM 7.9* 7.0*  PROT 5.7* 4.3*  BILITOT 0.9 0.6  ALKPHOS 98 73  ALT 308* 249*  AST 403* 323*  GLUCOSE 129* 139*   Micro Results: Recent Results (from the past 240 hour(s))  Culture, blood (Routine X 2) w Reflex to ID Panel     Status: None (Preliminary result)   Collection Time: 12/30/15  8:36 AM  Result Value Ref Range Status   Specimen Description BLOOD RIGHT ANTECUBITAL  Final   Special Requests IN PEDIATRIC BOTTLE  3CC  Final   Culture PENDING  Incomplete   Report Status PENDING  Incomplete   Studies/Results: Dg Chest 2 View  12/29/2015  CLINICAL DATA:  Nonproductive cough and fever for 5 days, nausea and vomiting for 4 days, diarrhea for 2 days. EXAM: CHEST  2 VIEW COMPARISON:  None. FINDINGS: The heart size and mediastinal contours are within normal limits. Both lungs are clear. The visualized skeletal structures are unremarkable. IMPRESSION: No active cardiopulmonary disease. Electronically Signed   By: Franki Cabot M.D.   On: 12/29/2015 20:03   Medications: I have reviewed the patient's current medications. Scheduled Meds: . doxycycline  100 mg Oral Q12H   Continuous Infusions: . 0.9 % NaCl with KCl 20 mEq / L 100 mL/hr at 12/30/15 1043   PRN Meds:.ibuprofen, promethazine Assessment/Plan: Principle problem:  Febrile illness with pancytopenia Active Problems:  Elevated transaminases  Hyponatremia    Hemoglobinuria  Kara Hull is a 30 year-old female with a history of endometriosis and uterine polyps who presents with a 7 day history of fever, fatigue, myalgias, cough, nausea, vomiting, diarrhea, and rash. On exam she was found to have  moderate right lower quadrant tenderness and a faint mottled macular rash on her bilateral forearms and hands.   Initial labs were significant for pancytopenia with a prolonged aPTT, elevated transaminases, hyponatremia, and large amounts of hemoglobin in her urine.   #Febrile illness with pancytopenia: Platelets found to be 50,000 (203,000 in 12/16), WBC 3.9, and Hgb 10.8. APTT prolonged to 49. Pregnancy test negative. HIV, hepatitis panel, and ANA all negative.  D dimer elevated to 5.34 with normal fibrinogen.  LDH elevated to 426 with a negative direct Coomb's test.   TSH mildly elevated to 4.79.   Etiology likely viral vs tick borne illness (RMSF vs anaplasmosis vs Borrelia) at this point given recent prodrome of  symptoms. Started on empiric doxycycline 6/13.     - GI pathogen panel pending - RMSF and Borrelia burgdorferi antibodies pending - follow up monospot - follow up blood cultures - follow up blood smear - Continue Doxy 100 mg PO BID - Ibuprofen 400 mg q6h prn for fever - Promethazine 12.5 mg PO q6h for nausea - CBC tomorrow morning   #Elevated transaminases: AST 403, ALT 308, albumin 2.9, alk phos 98, TB 0.9, total protein elevated at 5.7. Transaminase levels down trending.  Suspect infectious cause at this time given her recent illness and associated symptoms. Also consider autoimmune hapatitis, drug induced, and Wilson disease.  Workup as above.  - follow up abdominal ultrasound - follow up monospot - CMP tomorrow morning.   #Hyponatremia: Na 125. Most likely from recent malnutrition secondary to inability to tolerate oral intake.Trending up to 132.   - IV normal saline 100 mL/hr normal saline with 20 meq KCl - CMP tomorrow morning  #Hemoglobinuria: Large amounts of hemoglobin on UA. 0-5 RBCs/hpf. Likely from underlying hemolysis given elevated LDH.    This is a Careers information officer Note.  The care of the patient was discussed with Dr. Posey Pronto and the assessment and plan formulated with their assistance.  Please see their attached note for official documentation of the daily encounter.     Jobie Quaker, Med Student 12/30/2015, 10:58 AM

## 2015-12-31 DIAGNOSIS — R509 Fever, unspecified: Secondary | ICD-10-CM

## 2015-12-31 LAB — COMPREHENSIVE METABOLIC PANEL
ALT: 204 U/L — AB (ref 14–54)
AST: 213 U/L — AB (ref 15–41)
Albumin: 2 g/dL — ABNORMAL LOW (ref 3.5–5.0)
Alkaline Phosphatase: 74 U/L (ref 38–126)
Anion gap: 5 (ref 5–15)
BILIRUBIN TOTAL: 0.7 mg/dL (ref 0.3–1.2)
BUN: 9 mg/dL (ref 6–20)
CO2: 20 mmol/L — ABNORMAL LOW (ref 22–32)
CREATININE: 0.7 mg/dL (ref 0.44–1.00)
Calcium: 7.4 mg/dL — ABNORMAL LOW (ref 8.9–10.3)
Chloride: 106 mmol/L (ref 101–111)
GFR calc Af Amer: >60 mL/min (ref >60)
GLUCOSE: 91 mg/dL (ref 65–99)
Potassium: 4.2 mmol/L (ref 3.5–5.1)
Sodium: 131 mmol/L — ABNORMAL LOW (ref 135–145)
TOTAL PROTEIN: 4.4 g/dL — AB (ref 6.5–8.1)

## 2015-12-31 LAB — CBC
HCT: 30.5 % — ABNORMAL LOW (ref 36.0–46.0)
Hemoglobin: 10.4 g/dL — ABNORMAL LOW (ref 12.0–15.0)
MCH: 29.5 pg (ref 26.0–34.0)
MCHC: 34.1 g/dL (ref 30.0–36.0)
MCV: 86.4 fL (ref 78.0–100.0)
PLATELETS: 78 10*3/uL — AB (ref 150–400)
RBC: 3.53 MIL/uL — ABNORMAL LOW (ref 3.87–5.11)
RDW: 12.2 % (ref 11.5–15.5)
WBC: 4.1 10*3/uL (ref 4.0–10.5)

## 2015-12-31 LAB — B. BURGDORFI ANTIBODIES: B burgdorferi Ab IgG+IgM: <0.91 {ISR} (ref 0.00–0.90)

## 2015-12-31 LAB — EPSTEIN-BARR VIRUS VCA ANTIBODY PANEL
EBV Early Antigen Ab, IgG: <9 U/mL (ref 0.0–8.9)
EBV NA IgG: 341 U/mL — ABNORMAL HIGH (ref 0.0–17.9)
EBV VCA IGG: 73 U/mL — AB (ref 0.0–17.9)

## 2015-12-31 MED ORDER — DOXYCYCLINE HYCLATE 100 MG PO TABS: 100.0000 mg | ORAL_TABLET | Freq: Two times a day (BID) | ORAL | Status: DC

## 2015-12-31 MED ORDER — IBUPROFEN 400 MG PO TABS: 400.0000 mg | ORAL_TABLET | Freq: Four times a day (QID) | ORAL | Status: DC | PRN

## 2015-12-31 MED FILL — DOXYCYCLINE 100 MG TABLET: 100 | 6 days supply | Qty: 12 | Fill #0

## 2015-12-31 MED FILL — ?IBUPROFEN 200 MG TABLET: 200 | 7 days supply | Qty: 60 | Fill #0

## 2015-12-31 NOTE — Progress Notes (Signed)
   Subjective: Patient feels better this morning. She still has some cough, but feels this is improving as well. She had a few loose stool overnight and a Tmax of 100.42F. She tolerated liquid diet and is eager to try a regular diet.   Objective: Vital signs in last 24 hours: Filed Vitals:   12/30/15 2157 12/31/15 0117 12/31/15 0549 12/31/15 1200  BP: 91/55  91/56   Pulse: 94  82   Temp: 99.5 F (37.5 C) 100.2 F (37.9 C) 97.6 F (36.4 C)   TempSrc:  Oral    Resp: 18  18   Height:    '5\' 3"'$  (1.6 m)  SpO2: 100%  100%    Weight change:   Intake/Output Summary (Last 24 hours) at 12/31/15 1311 Last data filed at 12/30/15 1700  Gross per 24 hour  Intake    760 ml  Output      0 ml  Net    760 ml   General: resting in bed, no acute distress Cardiac: RRR, no rubs, murmurs or gallops Pulm: clear to auscultation bilaterally, moving normal volumes of air Abd: soft, nontender, nondistended, BS present Ext: warm and well perfused, no pedal edema    Assessment/Plan: Active Problems:   Abnormal liver enzymes   Thrombocytopenia (HCC)   Fever, unspecified  Febrile Illness with Pancytopenia: Workup so far seems most consistent with a viral illness. Tick-borne disease should also be considered and she was empirically started on Doxycycline for this. GI pathogen panel is negative. - Continue oral Doxycycline 100 mg BID for 1 week total course -Regular diet - HIV is negative - RMSF and Borrelia burgdorfi antibodies pending - EBV pending - f/u blood cultures -I buprofen 400 mg q6h prn fever  Elevated transaminases: AST 403, ALT 308, albumin 2.9, alk phos 98, TB 0.9, total protein elevated at 5.7 on admission. Viral hepatitis work up is negative. Differentials still include EBV, autoimmune hepatitis, drug induced, and Wilson disease.Suspect viral etiology at this time given her recent illness and associated symptoms. LFTs continue to downtrend. Abdominal U/S was unremarkable. - f/u EBV  studies - Monitor CMP   Hyponatremia: Na 131. Most likely from recent malnutrition secondary to inability to tolerate oral intake.  - Advance to regular diet   Dispo: Anticipate discharge to home today with close follow up in [facility].  The patient does not have a current PCP (No primary care provider on file.) and does need an [facility] hospital follow-up appointment after discharge.  The patient does not have transportation limitations that hinder transportation to clinic appointments.      Zada Finders, MD 12/31/2015, 1:11 PM

## 2015-12-31 NOTE — Progress Notes (Signed)
Subjective: No acute events overnight.  Overall, she feels much better today.  She spiked a fever up to 100.2 F and felt sweaty.  Patient reports 3-4 episodes of diarrhea since we last saw her yesterday morning.  She also endorses swelling in her face and legs.  Denies nausea and vomiting.  Admits to a cough.  Tolerating a clear liquid diet, would like to try to eat regular food. No other complaints.    Objective: Vital signs in last 24 hours: Filed Vitals:   12/30/15 1401 12/30/15 2157 12/31/15 0117 12/31/15 0549  BP: 91/54 91/55  91/56  Pulse: 95 94  82  Temp: 99 F (37.2 C) 99.5 F (37.5 C) 100.2 F (37.9 C) 97.6 F (36.4 C)  TempSrc: Oral  Oral   Resp: _0 Height:      SpO2: 100% 100%  100%   Weight change:   Intake/Output Summary (Last 24 hours) at 12/31/15 0831 Last data filed at 12/30/15 1700  Gross per 24 hour  Intake   1120 ml  Output      0 ml  Net   1120 ml   Physical Exam: General: Comfortable appearing. Resting in bed in no acute distress HEENT: Anicteric sclera.  No lymphadenopathy.   CV: Regular rate and rhythm. Normal S1 and S2. No murmurs.  Lungs: Normal work of breathing. Clear to auscultation bilaterally without crackles or wheezes. Abdomen: soft, nontender, and nondistended. Normal bowel sounds. No hepatosplenomegaly.  Ext: no peripheral edema. Pulses 2+ throughout Skin: Rash no longer visible on the arms or abdomen.     Lab Results:  Recent Labs Lab 12/29/15 1630 12/30/15 0538 12/31/15 0554  WBC 4.2 3.9* 4.1  HGB 13.3 10.8* 10.4*  HCT 36.9 30.5* 30.5*  PLT 50* 48* 78*    Recent Labs Lab 12/29/15 1630 12/30/15 0537 12/31/15 0554  NA 125* 132* 131*  K 4.0 4.3 4.2  CL 96* 108 106  CO2 21* 21* 20*  BUN _1 CREATININE 0.77 0.80 0.70  CALCIUM 7.9* 7.0* 7.4*  PROT 5.7* 4.3* 4.4*  BILITOT 0.9 0.6 0.7  ALKPHOS 98 73 74  ALT 308* 249* 204*  AST 403* 323* 213*  GLUCOSE 129* 139* 91   Micro Results: Recent  Results (from the past 240 hour(s))  Gastrointestinal Panel by PCR , Stool     Status: None   Collection Time: 12/29/15 11:34 PM  Result Value Ref Range Status   Campylobacter species NOT DETECTED NOT DETECTED Final   Plesimonas shigelloides NOT DETECTED NOT DETECTED Final   Salmonella species NOT DETECTED NOT DETECTED Final   Yersinia enterocolitica NOT DETECTED NOT DETECTED Final   Vibrio species NOT DETECTED NOT DETECTED Final   Vibrio cholerae NOT DETECTED NOT DETECTED Final   Enteroaggregative E coli (EAEC) NOT DETECTED NOT DETECTED Final   Enteropathogenic E coli (EPEC) NOT DETECTED NOT DETECTED Final   Enterotoxigenic E coli (ETEC) NOT DETECTED NOT DETECTED Final   Shiga like toxin producing E coli (STEC) NOT DETECTED NOT DETECTED Final   E. coli O157 NOT DETECTED NOT DETECTED Final   Shigella/Enteroinvasive E coli (EIEC) NOT DETECTED NOT DETECTED Final   Cryptosporidium NOT DETECTED NOT DETECTED Final   Cyclospora cayetanensis NOT DETECTED NOT DETECTED Final   Entamoeba histolytica NOT DETECTED NOT DETECTED Final   Giardia lamblia NOT DETECTED NOT DETECTED Final   Adenovirus F40/41 NOT DETECTED NOT DETECTED Final   Astrovirus NOT DETECTED NOT DETECTED Final   Norovirus  GI/GII NOT DETECTED NOT DETECTED Final   Rotavirus A NOT DETECTED NOT DETECTED Final   Sapovirus (I, II, IV, and V) NOT DETECTED NOT DETECTED Final  Culture, blood (Routine X 2) w Reflex to ID Panel     Status: None (Preliminary result)   Collection Time: 12/30/15  8:36 AM  Result Value Ref Range Status   Specimen Description BLOOD RIGHT ANTECUBITAL  Final   Special Requests IN PEDIATRIC BOTTLE  3CC  Final   Culture PENDING  Incomplete   Report Status PENDING  Incomplete   Studies/Results: Dg Chest 2 View  12/29/2015  CLINICAL DATA:  Nonproductive cough and fever for 5 days, nausea and vomiting for 4 days, diarrhea for 2 days. EXAM: CHEST  2 VIEW COMPARISON:  None. FINDINGS: The heart size and mediastinal  contours are within normal limits. Both lungs are clear. The visualized skeletal structures are unremarkable. IMPRESSION: No active cardiopulmonary disease. Electronically Signed   By: Franki Cabot M.D.   On: 12/29/2015 20:03   US Abdomen Complete  12/30/2015  CLINICAL DATA:  Dehydration elevated LFTs EXAM: ABDOMEN ULTRASOUND COMPLETE COMPARISON:  None. FINDINGS: Gallbladder: No gallstones or wall thickening visualized. No sonographic Murphy sign noted by sonographer. Common bile duct: Diameter: 4.6 mm. Liver: No focal lesion identified. Within normal limits in parenchymal echogenicity. IVC: No abnormality visualized. Pancreas: Visualized portion unremarkable. Spleen: Size and appearance within normal limits. Right Kidney: Length: 10.7 cm. Echogenicity within normal limits. No mass or hydronephrosis visualized. Left Kidney: Length: 11.5 cm. Echogenicity within normal limits. No mass or hydronephrosis visualized. Abdominal aorta: No aneurysm visualized. Other findings: None. IMPRESSION: No acute abnormality noted. Electronically Signed   By: Inez Catalina M.D.   On: 12/30/2015 13:58   Medications: I have reviewed the patient's current medications. Scheduled Meds: . doxycycline  100 mg Oral Q12H   Continuous Infusions:  PRN Meds:.ibuprofen, promethazine Assessment/Plan: Principle problem:  Febrile illness with pancytopenia Active Problems:  Elevated transaminases  Hyponatremia  Hemoglobinuria  Ms. Bogosian is a 30 year-old female with a history of endometriosis and uterine polyps who presents with a 7 day history of fever, fatigue, myalgias, cough, nausea, vomiting, diarrhea, and rash. On exam she was found to have moderate right lower quadrant tenderness and a faint mottled macular rash on her bilateral forearms and hands. Initial labs were significant for pancytopenia with a prolonged aPTT, elevated transaminases, hyponatremia, and large amounts of hemoglobin in her urine.   #Febrile  illness with pancytopenia: Platelets found to be 50,000 (203,000 in 12/16), WBC 3.9, and Hgb 10.8. APTT prolonged to 49. Pregnancy test negative. HIV, hepatitis panel, and ANA all negative. GI pathogen panel negative.  Blood smear unremarkable.  D dimer elevated to 5.34 with normal fibrinogen. LDH elevated to 426 with a negative direct Coomb's test. TSH mildly elevated to 4.79. Etiology likely viral vs tick borne illness (RMSF vs anaplasmosis vs Borrelia) at this point given recent prodrome of symptoms. Started on empiric doxycycline 6/13.  - RMSF and Borrelia burgdorferi antibodies pending - follow up monospot - follow up blood cultures - Continue Doxy 100 mg PO BID - Ibuprofen 400 mg q6h prn for fever - Promethazine 12.5 mg PO q6h for nausea  #Elevated transaminases: AST 403, ALT 308, albumin 2.9, alk phos 98, TB 0.9, total protein elevated at 5.7. Transaminase levels down trending. Suspect infectious cause at this time given her recent illness and associated symptoms. Also consider autoimmune hapatitis, drug induced, and Wilson disease. Workup as above. US abdomen unremarkable  6/14.  - follow up monospot - CMP tomorrow morning.   #Hyponatremia: Na 125. Most likely from recent malnutrition secondary to inability to tolerate oral intake.Trending up to 132.  - CMP tomorrow morning  #Hemoglobinuria: Large amounts of hemoglobin on UA. 0-5 RBCs/hpf. Likely from underlying hemolysis given elevated LDH.   This is a Careers information officer Note.  The care of the patient was discussed with Dr. Posey Pronto and the assessment and plan formulated with their assistance.  Please see their attached note for official documentation of the daily encounter.     Jobie Quaker, Med Student 12/31/2015, 8:31 AM

## 2015-12-31 NOTE — Progress Notes (Signed)
NURSING PROGRESS NOTE  Kara BradyYanyan Hull [id number] Discharge Data: 12/31/2015 3:11 PM Attending Provider: Earl LagosNischal Narendra, MD PCP:No primary care provider on file.     Kara BradyYanyan Hull to be D/C'd Home per MD order.  Discussed with the patient the After Visit Summary and all questions fully answered. All IV's discontinued with no bleeding noted. All belongings returned to patient for patient to take home.   Last Vital Signs:  Blood pressure 91/56, pulse 82, temperature 97.6 F (36.4 C), temperature source Oral, resp. rate 18, height 5\' 3"  (1.6 m), last menstrual period 12/29/2015, SpO2 100 %.  Discharge Medication List   Medication List    STOP taking these medications        dicyclomine 10 MG capsule  Commonly known as:  BENTYL     metroNIDAZOLE 500 MG tablet  Commonly known as:  FLAGYL     ondansetron 4 MG tablet  Commonly known as:  ZOFRAN      TAKE these medications        benzonatate 100 MG capsule  Commonly known as:  TESSALON  Take 100 mg by mouth 3 (three) times daily.     doxycycline 100 MG tablet  Commonly known as:  VIBRA-TABS  Take 1 tablet (100 mg total) by mouth 2 (two) times daily.     ibuprofen 400 MG tablet  Commonly known as:  ADVIL,MOTRIN  Take 1 tablet (400 mg total) by mouth every 6 (six) hours as needed for fever.     promethazine 25 MG tablet  Commonly known as:  PHENERGAN  Take 25 mg by mouth every 6 (six) hours as needed for nausea or vomiting.     REFRESH OP  Place 2 drops into both eyes as needed (for dry eyes).

## 2015-12-31 NOTE — Discharge Summary (Signed)
Name: Kara Hull MRN: [id number] DOB: 13-Oct-1985 30 y.o. PCP: No primary care provider on file.  Date of Admission: 12/29/2015  6:18 PM Date of Discharge: 12/31/2015 Attending Physician: Murriel Hopper, MD  Discharge Diagnosis: 1. Febrile Illness with Pancytopenia and Elevated LFTs Active Problems:   Abnormal liver enzymes   Thrombocytopenia (HCC)   Fever, unspecified  Discharge Medications:   Medication List    STOP taking these medications        dicyclomine 10 MG capsule  Commonly known as:  BENTYL     metroNIDAZOLE 500 MG tablet  Commonly known as:  FLAGYL     ondansetron 4 MG tablet  Commonly known as:  ZOFRAN      TAKE these medications        benzonatate 100 MG capsule  Commonly known as:  TESSALON  Take 100 mg by mouth 3 (three) times daily.     doxycycline 100 MG tablet  Commonly known as:  VIBRA-TABS  Take 1 tablet (100 mg total) by mouth 2 (two) times daily.     ibuprofen 400 MG tablet  Commonly known as:  ADVIL,MOTRIN  Take 1 tablet (400 mg total) by mouth every 6 (six) hours as needed for fever.     promethazine 25 MG tablet  Commonly known as:  PHENERGAN  Take 25 mg by mouth every 6 (six) hours as needed for nausea or vomiting.     REFRESH OP  Place 2 drops into both eyes as needed (for dry eyes).        Disposition and follow-up:   [name] was discharged from [facility] in Stable condition.  At the hospital follow up visit please address:  1.  Resolution of symptoms, completion of antibiotics,   2.  Labs / imaging needed at time of follow-up: CMP  3.  Pending labs/ test needing follow-up: [place] mtn spotted fvr, EBV  Follow-up Appointments: Follow-up Information    Follow up with Albin Felling, MD. Go on 01/11/2016.   Specialty:  Internal Medicine   Why:  @ 10:15 am for hospital follow up   Contact information:   [place] [address] [id number] [phone]       Discharge Instructions: Discharge  Instructions    Call MD for:  difficulty breathing, headache or visual disturbances    Complete by:  As directed      Call MD for:  hives    Complete by:  As directed      Call MD for:  persistant dizziness or light-headedness    Complete by:  As directed      Call MD for:  severe uncontrolled pain    Complete by:  As directed      Diet - low sodium heart healthy    Complete by:  As directed      Increase activity slowly    Complete by:  As directed            Consultations:    Procedures Performed:  Dg Chest 2 View  12/29/2015  CLINICAL DATA:  Nonproductive cough and fever for 5 days, nausea and vomiting for 4 days, diarrhea for 2 days. EXAM: CHEST  2 VIEW COMPARISON:  None. FINDINGS: The heart size and mediastinal contours are within normal limits. Both lungs are clear. The visualized skeletal structures are unremarkable. IMPRESSION: No active cardiopulmonary disease. Electronically Signed   By: Franki Cabot M.D.   On: 12/29/2015 20:03   US Abdomen Complete  12/30/2015  CLINICAL DATA:  Dehydration elevated LFTs EXAM: ABDOMEN ULTRASOUND COMPLETE COMPARISON:  None. FINDINGS: Gallbladder: No gallstones or wall thickening visualized. No sonographic Murphy sign noted by sonographer. Common bile duct: Diameter: 4.6 mm. Liver: No focal lesion identified. Within normal limits in parenchymal echogenicity. IVC: No abnormality visualized. Pancreas: Visualized portion unremarkable. Spleen: Size and appearance within normal limits. Right Kidney: Length: 10.7 cm. Echogenicity within normal limits. No mass or hydronephrosis visualized. Left Kidney: Length: 11.5 cm. Echogenicity within normal limits. No mass or hydronephrosis visualized. Abdominal aorta: No aneurysm visualized. Other findings: None. IMPRESSION: No acute abnormality noted. Electronically Signed   By: Inez Catalina M.D.   On: 12/30/2015 13:58    2D Echo: n/a  Cardiac Cath: n/a  Admission HPI:  [name] is a 30 year old lady  with PMH of self-reported endometriosis with chocolate cyst and uterine polyp s/p resection (June 2016) who presented to [facility] to establish care and follow up for fever and abnormal labs at urgent care. She is Mandarin speaking and communication is done through a Optometrist. Her husband is present who provides additional history. 7 days ago she began to have fevers and headache. She later developed muscle aches, fatigue, nausea, vomiting (whenever she ate), cough with scant white/clear sputum production, diarrhea (began 2 days ago), abdominal pain, and night sweats. She developed a non-pruritic rash on her arms last night and husband reports that she has woken in terror overnight as well. She has had nightly fevers, highest of 103F, which she has been treating with alternating Tylenol and Ibuprofen. She was seen at Urgent care on 6/10 and given Tessalon and Promethazine without improvement.  Patient was again seen at Urgent care on 6/12 where she was found to be hypotensive at 53/36 mm Hg and was given 2 L of IVF. She was given IM B12, promethazine, and ondansetron. Lab workup showed +protein on UA, WBC 3.5 and Hgb 11.7 (previously 13 in 12/16), MCV 89, Plts 39. 0.4 abs lymphs, 37% bands. Cr 1.02 (baseline 0.65). Na 127, albumin 3.1. AST 374 and ALT 271. Alk phos and T bili were normal at 73 and 0.6 respectively. Influenza A and B were negative.  Patient denies any sick contacts, bug or tick bites, or pet exposure. She denies any history of blood transfusion. She has not noticed any swollen lumps or bumps. She moved to the [place] from [place] and was last in [place] from 10/2014 - 01/2015. Patient reports she is currently menstruating, but this is less than usual.   Social Hx: never smoker, no alcohol or illicit drug use   Family Hx: MGM- lung cancer, MGF - DM, mother - DM  Hospital Course by problem list: Active Problems:   Abnormal liver enzymes   Thrombocytopenia (HCC)   Fever, unspecified   Febrile  illness with pancytopenia: Platelets found to be 50,000 (203,000 in 12/16), WBC 3.9, and Hgb 10.8.    APTT prolonged to 49.  Tmax 102.1. Pregnancy test negative. CXR without active cardiopulmonary disease. HIV, hepatitis panel, and ANA all negative.  GI pathogen panel negative.  Blood smear unremarkable.  D dimer elevated to 5.34 with normal fibrinogen.  LDH elevated to 426 with a negative direct Coomb's test.   TSH mildly elevated to 4.79.   Etiology likely viral but also considered tick borne illness (RMSF vs anaplasmosis vs Borrelia) given recent pro-drome of symptoms. Borrelia was negative. Started on empiric doxycycline 6/13 to complete a 7 day course. Patient improved with antibiotics and  supportive care (IVF hydration, Ibuprofen prn fevers).    Elevated transaminases:  AST 403, ALT 308, albumin 2.9, alk phos 98, TB 0.9, total protein elevated at 5.7.  Transaminase levels down trended during admission.   Suspected viral infection as the cause given her recent illness and associated symptoms.  Also considered autoimmune hepatitis, drug induced, and Wilson disease but these were considered less likely given her associated multisystem symptoms. Workup as above.  US abdomen was unremarkable on 6/14.    Hyponatremia: Na 125.  Most likely from recent malnutrition secondary to inability to tolerate oral intake. Trended up to 132 at the time of discharge.      Discharge Vitals:   BP 91/56 mmHg  Pulse 82  Temp(Src) 97.6 F (36.4 C) (Oral)  Resp 18  Ht 5' 3"  (1.6 m)  SpO2 100%  LMP 12/29/2015  Discharge Labs:  Results for orders placed or performed during the hospital encounter of 12/29/15 (from the past 24 hour(s))  Comprehensive metabolic panel     Status: Abnormal   Collection Time: 12/31/15  5:54 AM  Result Value Ref Range   Sodium 131 (L) 135 - 145 mmol/L   Potassium 4.2 3.5 - 5.1 mmol/L   Chloride 106 101 - 111 mmol/L   CO2 20 (L) 22 - 32 mmol/L   Glucose, Bld 91 65 - 99 mg/dL   BUN 9 6  - 20 mg/dL   Creatinine, Ser 0.70 0.44 - 1.00 mg/dL   Calcium 7.4 (L) 8.9 - 10.3 mg/dL   Total Protein 4.4 (L) 6.5 - 8.1 g/dL   Albumin 2.0 (L) 3.5 - 5.0 g/dL   AST 213 (H) 15 - 41 U/L   ALT 204 (H) 14 - 54 U/L   Alkaline Phosphatase 74 38 - 126 U/L   Total Bilirubin 0.7 0.3 - 1.2 mg/dL   GFR calc non Af Amer >60 >60 mL/min   GFR calc Af Amer >60 >60 mL/min   Anion gap 5 5 - 15  CBC     Status: Abnormal   Collection Time: 12/31/15  5:54 AM  Result Value Ref Range   WBC 4.1 4.0 - 10.5 K/uL   RBC 3.53 (L) 3.87 - 5.11 MIL/uL   Hemoglobin 10.4 (L) 12.0 - 15.0 g/dL   HCT 30.5 (L) 36.0 - 46.0 %   MCV 86.4 78.0 - 100.0 fL   MCH 29.5 26.0 - 34.0 pg   MCHC 34.1 30.0 - 36.0 g/dL   RDW 12.2 11.5 - 15.5 %   Platelets 78 (L) 150 - 400 K/uL    Signed: Zada Finders, MD 12/31/2015, 3:59 PM    Services Ordered on Discharge: none Equipment Ordered on Discharge: none

## 2015-12-31 NOTE — Discharge Instructions (Signed)
Please continue to drink plenty of fluids to stay hydrated. You may take Ibuprofen (Advil) for fever, headache, or pain as directed.   Your illness was likely due to a virus. Please follow up with the Internal Medicine Clinic.  Please complete the antibiotics as prescribed.

## 2015-12-31 NOTE — Progress Notes (Signed)
Internal Medicine Clinic Attending  Case discussed with Dr. Krall at the time of the visit.  We reviewed the resident's history and exam and pertinent patient test results.  I agree with the assessment, diagnosis, and plan of care documented in the resident's note.  

## 2016-01-01 LAB — ROCKY MTN SPOTTED FVR ABS PNL(IGG+IGM)
RMSF IgG: NEGATIVE
RMSF IgM: 0.36 index (ref 0.00–0.89)

## 2016-01-04 ENCOUNTER — Telehealth: Payer: Self-pay | Admitting: Internal Medicine

## 2016-01-04 LAB — CULTURE, BLOOD (ROUTINE X 2)
CULTURE: NO GROWTH
CULTURE: NO GROWTH

## 2016-01-04 NOTE — Telephone Encounter (Signed)
Need TOC discharge dat e 12/31/15 HFU 01/11/16

## 2016-01-11 ENCOUNTER — Encounter: Payer: Self-pay | Admitting: Internal Medicine

## 2016-01-11 ENCOUNTER — Ambulatory Visit (INDEPENDENT_AMBULATORY_CARE_PROVIDER_SITE_OTHER): Payer: Self-pay | Admitting: Internal Medicine

## 2016-01-11 VITALS — BP 120/83 | HR 93 | Temp 97.6°F | Ht 63.0 in | Wt 109.9 lb

## 2016-01-11 DIAGNOSIS — R748 Abnormal levels of other serum enzymes: Secondary | ICD-10-CM

## 2016-01-11 DIAGNOSIS — D61818 Other pancytopenia: Secondary | ICD-10-CM | POA: Insufficient documentation

## 2016-01-11 NOTE — Assessment & Plan Note (Signed)
Her transaminases were noted to be elevated at AST 403 and ALT 308 during her acute febrile illness with associated pancytopenia. Abdominal US was unremarkable. By time of discharge her AST had improved to 213 and ALT 204. Will recheck a CMET today to assess her liver function and if normalized now that she is doing well.

## 2016-01-11 NOTE — Progress Notes (Signed)
   Subjective:    Patient ID: Kara Hull, female    DOB: 05/01/1986, 30 y.o.   MRN:Duard Brady [id number]  HPI [name] is a 30yo woman with PMHx of endometriosis who presents today for hospital follow up from her febrile illness with pancytopenia. She speaks Mandarin and her husband was present to help translate.   She was hospitalized from 6/13-6/15 after she was evaluated in clinic and found to be febrile with pancytopenia and elevated transaminases. Her platelets on admission were 50,000, down from 203,000 in Dec 2016. Her WBC count was 3.9 and Hgb 10.8 (previously stable at 13). Her LFTs were elevated with AST 403 and ALT 308. The cause of her acute illness was thought to be secondary to viral vs tick-borne illness. All tests for infection, including blood cultures, CXR, UA, GI pathogen panel, HIV, and hepatitis were negative. [facility] and Lyme disease which were negative and her EBV studies were consistent with past infection. She was placed on Doxycycline for a 7 day course, which she completed 6 out of 7 days. She had stopped Doxy 1 day early as she was feeling better. At time of discharge her WBC count had normalized to 4.1, Hgb stable at 10.4, platelets trending up to 78,000, and transaminases improving to AST 213 and ALT 204.   She reports she has been feeling much better since hospitalization. She feels at her baseline. Denies any fevers, sore throat, chest pain, abdominal pain, nausea, vomiting, SOB, cough, bleeding, rashes, melena, or hematochezia. She reports a good appetite.   Review of Systems General: Denies night sweats, changes in weight HEENT: Denies headaches, ear pain, changes in vision, rhinorrhea CV: Denies palpitations, orthopnea Pulm: Denies wheezing GI: See HPI GU: Denies dysuria, hematuria, frequency Msk: Denies muscle cramps, joint pains Neuro: Denies weakness, numbness, tingling Skin: Denies bruising Psych: Denies depression, anxiety,  hallucinations    Objective:   Physical Exam General: young woman sitting up, pleasant, NAD HEENT: [place]/AT, EOMI, sclera anicteric, pharynx non-erythematous, mucus membranes moist CV: RRR, no m/g/r Pulm: CTA bilaterally, breaths non-labored Abd: BS+, soft, non-tender, no hepatosplenomegaly Ext: warm, no peripheral edema Neuro: alert and oriented x 3 Skin: No rashes or evidence of bleeding    Assessment & Plan:  Please refer to A&P documentation.

## 2016-01-11 NOTE — Patient Instructions (Signed)
General Instructions: - Blood work today. I will call you if there are any abnormalities. - You can stay with our clinic. Please call and cancel appointment at [facility]. - Please contact our clinic [phone]) if anything else comes up  Please bring your medicines with you each time you come to clinic.  Medicines may include prescription medications, over-the-counter medications, herbal remedies, eye drops, vitamins, or other pills.   Progress Toward Treatment Goals:  No flowsheet data found.  Self Care Goals & Plans:  No flowsheet data found.  No flowsheet data found.   Care Management & Community Referrals:  No flowsheet data found.

## 2016-01-11 NOTE — Assessment & Plan Note (Signed)
She was discovered to have a febrile illness with pancytopenia and admitted from 6/13-6/15. Her illness was thought to be viral vs tick-borne mediated. By time of discharge her WBC count had normalized but she was still anemia with Hgb 10.4 and thrombocytopenic with platelets 78,000. She has completed a 6 day course of Doxycycline and is feeling much better. No concerning symptoms. Will check a repeat CBC today to assess her blood counts.

## 2016-01-12 LAB — CMP14 + ANION GAP
ALT: 63 IU/L — AB (ref 0–32)
AST: 45 IU/L — ABNORMAL HIGH (ref 0–40)
Albumin/Globulin Ratio: 1.5 (ref 1.2–2.2)
Albumin: 4.5 g/dL (ref 3.5–5.5)
Alkaline Phosphatase: 88 IU/L (ref 39–117)
Anion Gap: 17 mmol/L (ref 10.0–18.0)
BUN / CREAT RATIO: 20 (ref 9–23)
BUN: 14 mg/dL (ref 6–20)
Bilirubin Total: 0.6 mg/dL (ref 0.0–1.2)
CO2: 22 mmol/L (ref 18–29)
CREATININE: 0.7 mg/dL (ref 0.57–1.00)
Calcium: 9.4 mg/dL (ref 8.7–10.2)
Chloride: 100 mmol/L (ref 96–106)
GFR calc Af Amer: 134 mL/min/{1.73_m2} (ref >59)
GFR, EST NON AFRICAN AMERICAN: 117 mL/min/{1.73_m2} (ref >59)
GLOBULIN, TOTAL: 3 g/dL (ref 1.5–4.5)
Glucose: 103 mg/dL — ABNORMAL HIGH (ref 65–99)
Potassium: 4.5 mmol/L (ref 3.5–5.2)
SODIUM: 139 mmol/L (ref 134–144)
TOTAL PROTEIN: 7.5 g/dL (ref 6.0–8.5)

## 2016-01-12 LAB — CBC
HEMATOCRIT: 39.6 % (ref 34.0–46.6)
Hemoglobin: 12.8 g/dL (ref 11.1–15.9)
MCH: 30.1 pg (ref 26.6–33.0)
MCHC: 32.3 g/dL (ref 31.5–35.7)
MCV: 93 fL (ref 79–97)
Platelets: 285 10*3/uL (ref 150–379)
RBC: 4.25 x10E6/uL (ref 3.77–5.28)
RDW: 14 % (ref 12.3–15.4)
WBC: 4.3 10*3/uL (ref 3.4–10.8)

## 2016-01-12 NOTE — Progress Notes (Signed)
Internal Medicine Clinic Attending  Case discussed with Dr. Rivet soon after the resident saw the patient.  We reviewed the resident's history and exam and pertinent patient test results.  I agree with the assessment, diagnosis, and plan of care documented in the resident's note.  

## 2016-01-12 NOTE — Telephone Encounter (Signed)
Gave patient test results

## 2016-01-21 NOTE — Telephone Encounter (Signed)
Came to hfu visit

## 2016-02-05 ENCOUNTER — Other Ambulatory Visit: Payer: Self-pay

## 2016-02-05 ENCOUNTER — Other Ambulatory Visit: Payer: Self-pay | Admitting: Family Medicine

## 2016-02-05 ENCOUNTER — Ambulatory Visit (INDEPENDENT_AMBULATORY_CARE_PROVIDER_SITE_OTHER): Payer: Self-pay | Admitting: Internal Medicine

## 2016-02-05 VITALS — BP 107/62 | HR 74 | Temp 98.2°F | Wt 112.9 lb

## 2016-02-05 DIAGNOSIS — Z3A00 Weeks of gestation of pregnancy not specified: Secondary | ICD-10-CM

## 2016-02-05 DIAGNOSIS — Z3480 Encounter for supervision of other normal pregnancy, unspecified trimester: Secondary | ICD-10-CM | POA: Insufficient documentation

## 2016-02-05 DIAGNOSIS — Z3481 Encounter for supervision of other normal pregnancy, first trimester: Secondary | ICD-10-CM

## 2016-02-05 DIAGNOSIS — N926 Irregular menstruation, unspecified: Secondary | ICD-10-CM

## 2016-02-05 DIAGNOSIS — O0001 Abdominal pregnancy with intrauterine pregnancy: Secondary | ICD-10-CM

## 2016-02-05 LAB — POCT URINE PREGNANCY: Preg Test, Ur: POSITIVE — AB

## 2016-02-05 MED ORDER — PRENATAL VITAMIN 27-0.8 MG PO TABS: 1.0000 | ORAL_TABLET | Freq: Every day | ORAL | Status: AC

## 2016-02-05 NOTE — Patient Instructions (Signed)
Thank you for coming to see me today. It was a pleasure. Today we talked about:   Congratulations on your pregnancy.  [facility] after you leave here to get some blood work.  Follow up with appointments listed above.    If you have any questions or concerns, please do not hesitate to call the office at [phone].  Take Care,   Gwynn BurlyAndrew Pius Byrom, DO

## 2016-02-05 NOTE — Progress Notes (Addendum)
Patient ID: Kara Hull, female   DOB: [birth year], 30 y.o.   MRN: [id number]   CC: follow up for positive home pregnancy test  HPI:  Kara Hull is a 30 y.o. woman with no significant PMHx except as noted below.  [facility] because she had a positive home pregnancy test and would like confirmation.  She was recently hospitalized due to a suspected viral infection and reports doing well post-hospitalization.  She is accompanied by her husband.  They report attempting to get pregnant for about the last year.  This would be her first pregnancy.  She reports feeling well and was only tipped off that she may be pregnant due to her missing her menstrual cycle.  LMP was June 13.  She has no history of miscarriage or lost pregnancy that she is aware of.  She has not been taking any form of pre-natal vitamins or had any OB care to this point.  Her urine pregnancy in the office was positive.  Past Medical History:  Diagnosis Date  . Right ovarian cyst   . Uterine polyp     Review of Systems:   Review of Systems  Constitutional: Negative for fever and chills.  Gastrointestinal: Negative for nausea and vomiting.    Physical Exam:  Vitals:   02/05/16 1442  BP: 107/62  Pulse: 74  Temp: 98.2 F (36.8 C)  TempSrc: Oral  SpO2: 100%  Weight: 112 lb 14.4 oz (51.2 kg)   Physical Exam  Constitutional: She is oriented to person, place, and time and well-developed, well-nourished, and in no distress.  HENT:  Head: Normocephalic and atraumatic.  Pulmonary/Chest: Effort normal.  Neurological: She is alert and oriented to person, place, and time.  Skin: Skin is warm and dry.     Assessment & Plan:   See encounters tab for problem based medical decision making.   Patient discussed with Dr. Oswaldo DoneVincent   Abdominal pregnancy with intrauterine pregnancy A: Patient presented due to a positive home pregnancy test and wanted confirmation.  Urine pregnancy in clinic was positive.  First date  of LMP was December 29, 2015.  Patient not taking pre-natal vitamins and has no established OB care and no form of insurance.  P: - contacted [facility] who were able to take on patient for pre-natal care - she will go to their office after visit to obtain pre-natal screening labs - follow up appointment scheduled for August 11 - prescription provided for pre-natal vitamins that will contain the recommended 400mcg daily of folic acid

## 2016-02-06 LAB — SICKLE CELL SCREEN: SICKLE CELL SCREEN: NEGATIVE

## 2016-02-06 LAB — HIV ANTIBODY (ROUTINE TESTING W REFLEX): HIV: NONREACTIVE

## 2016-02-07 LAB — CULTURE, OB URINE: ORGANISM ID, BACTERIA: NO GROWTH

## 2016-02-08 LAB — OBSTETRIC PANEL
ANTIBODY SCREEN: NEGATIVE
BASOS ABS: 0 {cells}/uL (ref 0–200)
Basophils Relative: 0 %
EOS ABS: 158 {cells}/uL (ref 15–500)
Eosinophils Relative: 2 %
HCT: 37.6 % (ref 35.0–45.0)
HEMOGLOBIN: 12.5 g/dL (ref 11.7–15.5)
Hepatitis B Surface Ag: NEGATIVE
LYMPHS PCT: 24 %
Lymphs Abs: 1896 cells/uL (ref 850–3900)
MCH: 30.9 pg (ref 27.0–33.0)
MCHC: 33.2 g/dL (ref 32.0–36.0)
MCV: 92.8 fL (ref 80.0–100.0)
MONOS PCT: 7 %
MPV: 11 fL (ref 7.5–12.5)
Monocytes Absolute: 553 cells/uL (ref 200–950)
Neutro Abs: 5293 cells/uL (ref 1500–7800)
Neutrophils Relative %: 67 %
Platelets: 222 10*3/uL (ref 140–400)
RBC: 4.05 MIL/uL (ref 3.80–5.10)
RDW: 14.3 % (ref 11.0–15.0)
RH TYPE: POSITIVE
RUBELLA: 8.79 {index} — AB (ref <0.90)
WBC: 7.9 10*3/uL (ref 3.8–10.8)

## 2016-02-08 NOTE — Assessment & Plan Note (Deleted)
A: Patient presented due to a positive home pregnancy test and wanted confirmation.  Urine pregnancy in clinic was positive.  First date of LMP was December 29, 2015.  Patient not taking pre-natal vitamins and has no established OB care and no form of insurance.  P: - contacted [facility] who were able to take on patient for pre-natal care - she will go to their office after visit to obtain pre-natal screening labs - follow up appointment scheduled for August 11 - prescription provided for pre-natal vitamins that will contain the recommended daily of folic acid

## 2016-02-08 NOTE — Assessment & Plan Note (Signed)
A: Patient presented due to a positive home pregnancy test and wanted confirmation.  Urine pregnancy in clinic was positive.  First date of LMP was December 29, 2015.  Patient not taking pre-natal vitamins and has no established OB care and no form of insurance.  P: - contacted [facility] who were able to take on patient for pre-natal care - she will go to their office after visit to obtain pre-natal screening labs - follow up appointment scheduled for August 11 - prescription provided for pre-natal vitamins that will contain the recommended 400mcg daily of folic acid  

## 2016-02-09 NOTE — Progress Notes (Signed)
Internal Medicine Clinic Attending  Case discussed with Dr. Wallace at the time of the visit.  We reviewed the resident's history and exam and pertinent patient test results.  I agree with the assessment, diagnosis, and plan of care documented in the resident's note.  

## 2016-02-10 NOTE — Progress Notes (Signed)
Cancelling order as lab not collected, no need to re-order 

## 2016-02-26 ENCOUNTER — Ambulatory Visit (INDEPENDENT_AMBULATORY_CARE_PROVIDER_SITE_OTHER): Payer: Self-pay | Admitting: Internal Medicine

## 2016-02-26 ENCOUNTER — Encounter: Payer: Self-pay | Admitting: Internal Medicine

## 2016-02-26 VITALS — BP 104/65 | HR 93 | Temp 98.3°F | Wt 116.0 lb

## 2016-02-26 DIAGNOSIS — R112 Nausea with vomiting, unspecified: Secondary | ICD-10-CM

## 2016-02-26 DIAGNOSIS — N939 Abnormal uterine and vaginal bleeding, unspecified: Secondary | ICD-10-CM

## 2016-02-26 LAB — POCT WET PREP (WET MOUNT)
CLUE CELLS WET PREP WHIFF POC: NEGATIVE
TRICHOMONAS WET PREP HPF POC: ABSENT

## 2016-02-26 LAB — POCT URINE PREGNANCY: Preg Test, Ur: POSITIVE — AB

## 2016-02-26 MED ORDER — PYRIDOXINE HCL 25 MG PO TABS: 25.0000 mg | ORAL_TABLET | Freq: Three times a day (TID) | ORAL | 0 refills | Status: DC | PRN

## 2016-02-26 MED ORDER — DOXYLAMINE SUCCINATE (SLEEP) 25 MG PO TABS: 25.0000 mg | ORAL_TABLET | Freq: Every evening | ORAL | Status: DC | PRN

## 2016-02-26 NOTE — Patient Instructions (Signed)
For your nausea, you can take Vitamin B6 every 8 hours as needed. You can also take Unisom nightly for nausea. Both are very safe for baby.   Please return in 4 weeks for your next prenatal visit.   Safe Medications in Pregnancy   Acne: Benzoyl Peroxide Salicylic Acid  Backache/Headache: Tylenol: 2 regular strength every 4 hours OR              2 Extra strength every 6 hours  Colds/Coughs/Allergies: Benadryl (alcohol free) 25 mg every 6 hours as needed Breath right strips Claritin Cepacol throat lozenges Chloraseptic throat spray Cold-Eeze- up to three times per day Cough drops, alcohol free Flonase (by prescription only) Guaifenesin Mucinex Robitussin DM (plain only, alcohol free) Saline nasal spray/drops Sudafed (pseudoephedrine) & Actifed ** use only after [redacted] weeks gestation and if you do not have high blood pressure Tylenol Vicks Vaporub Zinc lozenges Zyrtec   Constipation: Colace Ducolax suppositories Fleet enema Glycerin suppositories Metamucil Milk of magnesia Miralax Senokot Smooth move tea  Diarrhea: Kaopectate Imodium A-D  *NO pepto Bismol  Hemorrhoids: Anusol Anusol HC Preparation H Tucks  Indigestion: Tums Maalox Mylanta Zantac  Pepcid  Insomnia: Benadryl (alcohol free)  every 6 hours as needed Tylenol PM Unisom, no Gelcaps  Leg Cramps: Tums MagGel  Nausea/Vomiting:  Bonine Dramamine Emetrol Ginger extract Sea bands Meclizine  Nausea medication to take during pregnancy:  Unisom (doxylamine succinate 25 mg tablets) Take one tablet daily at bedtime. If symptoms are not adequately controlled, the dose can be increased to a maximum recommended dose of two tablets daily (1/2 tablet in the morning, 1/2 tablet mid-afternoon and one at bedtime). Vitamin B6  tablets. Take one tablet twice a day (up to 200 mg per day).  Skin Rashes: Aveeno products Benadryl cream or  every 6 hours as needed Calamine Lotion 1%  cortisone cream  Yeast infection: Gyne-lotrimin 7 Monistat 7   **If taking multiple medications, please check labels to avoid duplicating the same active ingredients **take medication as directed on the label ** Do not exceed 4000 mg of tylenol in 24 hours **Do not take medications that contain aspirin or ibuprofen

## 2016-02-26 NOTE — Progress Notes (Signed)
Kara Hull is a 30 y.o. yo G1P0 at 7223w3d by LMP who presents for her initial prenatal visit. Pregnancy is planned She reports breast tenderness, nausea and has vomited 2-3 times in the past 2 weeks. . Reports a few episodes of brown discharge with concern for vaginal bleeding.  She  is taking PNV. See flow sheet for details.  PMH, POBH, FH, meds, allergies and Social Hx reviewed.  Prenatal Exam: Gen: Well nourished, well developed.  No distress.  Vitals noted. HEENT: Normocephalic, atraumatic.  Neck supple without cervical lymphadenopathy, thyromegaly or thyroid nodules.  Fair dentition. CV: RRR no murmur, gallops or rubs Lungs: CTAB.  Normal respiratory effort without wheezes or rales. Abd: soft, NTND. +BS.  Uterus not appreciated above pelvis. GU: Normal external female genitalia without lesions.  Normal vaginal, well rugated without lesions. Small amount of white-brown discharge present at cervical os.  Bimanual exam: No adnexal mass or TTP. No CMT.  Uterus size consistent with dates. Ext: No clubbing, cyanosis or edema. Psych: Normal grooming and dress.  Not depressed or anxious appearing.  Normal thought content and process without flight of ideas or looseness of associations.  Assessment & Plan:  30 y.o. yo G1P0 at 5323w3d via LMP doing well.  Current pregnancy issues include mild emesis. Will give Rx for Vitamin B6 and Unisom that she can use as needed.  Upreg repeated today given report of vaginal bleeding. Upreg was positive.  Dating is reliable. Wet prep obtained today and negative. GC/Chlamydia collected.  Prenatal labs reviewed and unremarkable. Genetic screening offered: Yes, will plan for Quad screen. Early glucola is not indicated.  PHQ-9 and Pregnancy Medical Home forms completed and reviewed.  Bleeding and pain precautions reviewed. Importance of prenatal vitamins reviewed.  Follow up in 4 weeks.

## 2016-02-29 LAB — CERVICOVAGINAL ANCILLARY ONLY
CHLAMYDIA, DNA PROBE: NEGATIVE
Neisseria Gonorrhea: NEGATIVE

## 2016-03-02 ENCOUNTER — Encounter: Payer: Self-pay | Admitting: *Deleted

## 2016-03-25 ENCOUNTER — Ambulatory Visit (INDEPENDENT_AMBULATORY_CARE_PROVIDER_SITE_OTHER): Payer: Self-pay | Admitting: Internal Medicine

## 2016-03-25 VITALS — BP 110/76 | HR 79 | Temp 98.7°F | Wt 117.2 lb

## 2016-03-25 DIAGNOSIS — Z3491 Encounter for supervision of normal pregnancy, unspecified, first trimester: Secondary | ICD-10-CM

## 2016-03-25 NOTE — Progress Notes (Signed)
[name] is a 30 y.o. G1P0 at 539w3d for routine follow up.  She reports no complaints. Denies any nausea or vomiting  See flow sheet for details.  FHR: 160 bpm  Uterus size 7 cm   A/P: Pregnancy at 5739w3d.  Doing well.   Pregnancy issues include None  Patient will need Quad Screen at next visit.  Anatomy ultrasound, no need to order at this visit, will order at next visit  Pt  is interested in genetic screening, next visit to receive Quad screen  Bleeding and pain precautions reviewed. Follow up 4 weeks.

## 2016-03-25 NOTE — Patient Instructions (Signed)
First Trimester of Pregnancy The first trimester of pregnancy is from week 1 until the end of week 12 (months 1 through 3). A week after a sperm fertilizes an egg, the egg will implant on the wall of the uterus. This embryo will begin to develop into a baby. Genes from you and your partner are forming the baby. The female genes determine whether the baby is a boy or a girl. At 6-8 weeks, the eyes and face are formed, and the heartbeat can be seen on ultrasound. At the end of 12 weeks, all the baby's organs are formed.  Now that you are pregnant, you will want to do everything you can to have a healthy baby. Two of the most important things are to get good prenatal care and to follow your health care provider's instructions. Prenatal care is all the medical care you receive before the baby's birth. This care will help prevent, find, and treat any problems during the pregnancy and childbirth. BODY CHANGES Your body goes through many changes during pregnancy. The changes vary from woman to woman.   You may gain or lose a couple of pounds at first.  You may feel sick to your stomach (nauseous) and throw up (vomit). If the vomiting is uncontrollable, call your health care provider.  You may tire easily.  You may develop headaches that can be relieved by medicines approved by your health care provider.  You may urinate more often. Painful urination may mean you have a bladder infection.  You may develop heartburn as a result of your pregnancy.  You may develop constipation because certain hormones are causing the muscles that push waste through your intestines to slow down.  You may develop hemorrhoids or swollen, bulging veins (varicose veins).  Your breasts may begin to grow larger and become tender. Your nipples may stick out more, and the tissue that surrounds them (areola) may become darker.  Your gums may bleed and may be sensitive to brushing and flossing.  Dark spots or blotches (chloasma,  mask of pregnancy) may develop on your face. This will likely fade after the baby is born.  Your menstrual periods will stop.  You may have a loss of appetite.  You may develop cravings for certain kinds of food.  You may have changes in your emotions from day to day, such as being excited to be pregnant or being concerned that something may go wrong with the pregnancy and baby.  You may have more vivid and strange dreams.  You may have changes in your hair. These can include thickening of your hair, rapid growth, and changes in texture. Some women also have hair loss during or after pregnancy, or hair that feels dry or thin. Your hair will most likely return to normal after your baby is born. WHAT TO EXPECT AT YOUR PRENATAL VISITS During a routine prenatal visit:  You will be weighed to make sure you and the baby are growing normally.  Your blood pressure will be taken.  Your abdomen will be measured to track your baby's growth.  The fetal heartbeat will be listened to starting around week 10 or 12 of your pregnancy.  Test results from any previous visits will be discussed. Your health care provider may ask you:  How you are feeling.  If you are feeling the baby move.  If you have had any abnormal symptoms, such as leaking fluid, bleeding, severe headaches, or abdominal cramping.  If you are using any tobacco products,   including cigarettes, chewing tobacco, and electronic cigarettes.  If you have any questions. Other tests that may be performed during your first trimester include:  Blood tests to find your blood type and to check for the presence of any previous infections. They will also be used to check for low iron levels (anemia) and Rh antibodies. Later in the pregnancy, blood tests for diabetes will be done along with other tests if problems develop.  Urine tests to check for infections, diabetes, or protein in the urine.  An ultrasound to confirm the proper growth  and development of the baby.  An amniocentesis to check for possible genetic problems.  Fetal screens for spina bifida and Down syndrome.  You may need other tests to make sure you and the baby are doing well.  HIV (human immunodeficiency virus) testing. Routine prenatal testing includes screening for HIV, unless you choose not to have this test. HOME CARE INSTRUCTIONS  Medicines  Follow your health care provider's instructions regarding medicine use. Specific medicines may be either safe or unsafe to take during pregnancy.  Take your prenatal vitamins as directed.  If you develop constipation, try taking a stool softener if your health care provider approves. Diet  Eat regular, well-balanced meals. Choose a variety of foods, such as meat or vegetable-based protein, fish, milk and low-fat dairy products, vegetables, fruits, and whole grain breads and cereals. Your health care provider will help you determine the amount of weight gain that is right for you.  Avoid raw meat and uncooked cheese. These carry germs that can cause birth defects in the baby.  Eating four or five small meals rather than three large meals a day may help relieve nausea and vomiting. If you start to feel nauseous, eating a few soda crackers can be helpful. Drinking liquids between meals instead of during meals also seems to help nausea and vomiting.  If you develop constipation, eat more high-fiber foods, such as fresh vegetables or fruit and whole grains. Drink enough fluids to keep your urine clear or pale yellow. Activity and Exercise  Exercise only as directed by your health care provider. Exercising will help you:  Control your weight.  Stay in shape.  Be prepared for labor and delivery.  Experiencing pain or cramping in the lower abdomen or low back is a good sign that you should stop exercising. Check with your health care provider before continuing normal exercises.  Try to avoid standing for long  periods of time. Move your legs often if you must stand in one place for a long time.  Avoid heavy lifting.  Wear low-heeled shoes, and practice good posture.  You may continue to have sex unless your health care provider directs you otherwise. Relief of Pain or Discomfort  Wear a good support bra for breast tenderness.   Take warm sitz baths to soothe any pain or discomfort caused by hemorrhoids. Use hemorrhoid cream if your health care provider approves.   Rest with your legs elevated if you have leg cramps or low back pain.  If you develop varicose veins in your legs, wear support hose. Elevate your feet for 15 minutes, 3-4 times a day. Limit salt in your diet. Prenatal Care  Schedule your prenatal visits by the twelfth week of pregnancy. They are usually scheduled monthly at first, then more often in the last 2 months before delivery.  Write down your questions. Take them to your prenatal visits.  Keep all your prenatal visits as directed by your   health care provider. Safety  Wear your seat belt at all times when driving.  Make a list of emergency phone numbers, including numbers for family, friends, the hospital, and police and fire departments. General Tips  Ask your health care provider for a referral to a local prenatal education class. Begin classes no later than at the beginning of month 6 of your pregnancy.  Ask for help if you have counseling or nutritional needs during pregnancy. Your health care provider can offer advice or refer you to specialists for help with various needs.  Do not use hot tubs, steam rooms, or saunas.  Do not douche or use tampons or scented sanitary pads.  Do not cross your legs for long periods of time.  Avoid cat litter boxes and soil used by cats. These carry germs that can cause birth defects in the baby and possibly loss of the fetus by miscarriage or stillbirth.  Avoid all smoking, herbs, alcohol, and medicines not prescribed by  your health care provider. Chemicals in these affect the formation and growth of the baby.  Do not use any tobacco products, including cigarettes, chewing tobacco, and electronic cigarettes. If you need help quitting, ask your health care provider. You may receive counseling support and other resources to help you quit.  Schedule a dentist appointment. At home, brush your teeth with a soft toothbrush and be gentle when you floss. SEEK MEDICAL CARE IF:   You have dizziness.  You have mild pelvic cramps, pelvic pressure, or nagging pain in the abdominal area.  You have persistent nausea, vomiting, or diarrhea.  You have a bad smelling vaginal discharge.  You have pain with urination.  You notice increased swelling in your face, hands, legs, or ankles. SEEK IMMEDIATE MEDICAL CARE IF:   You have a fever.  You are leaking fluid from your vagina.  You have spotting or bleeding from your vagina.  You have severe abdominal cramping or pain.  You have rapid weight gain or loss.  You vomit blood or material that looks like coffee grounds.  You are exposed to German measles and have never had them.  You are exposed to fifth disease or chickenpox.  You develop a severe headache.  You have shortness of breath.  You have any kind of trauma, such as from a fall or a car accident.   This information is not intended to replace advice given to you by your health care provider. Make sure you discuss any questions you have with your health care provider.   Document Released: 06/28/2001 Document Revised: 07/25/2014 Document Reviewed: 05/14/2013 Elsevier Interactive Patient Education 2016 Elsevier Inc.  

## 2016-04-22 ENCOUNTER — Ambulatory Visit (INDEPENDENT_AMBULATORY_CARE_PROVIDER_SITE_OTHER): Payer: Self-pay | Admitting: Internal Medicine

## 2016-04-22 VITALS — BP 98/60 | HR 78 | Temp 98.6°F | Wt 120.0 lb

## 2016-04-22 DIAGNOSIS — Z3492 Encounter for supervision of normal pregnancy, unspecified, second trimester: Secondary | ICD-10-CM

## 2016-04-22 NOTE — Progress Notes (Addendum)
Kara Hull is a 30 y.o. G1P000 at 3450w3d for routine follow up.  She reports no complaints See flow sheet for details. No pain, no vaginal bleeding, no nausea or vomiting   FHT 145 bpm Uterine Size 14 cm   A/P: Pregnancy at 4550w3d.  Doing well.   Pregnancy issues include none. Flu shot can be obtained at health department as patient is [facility] Anatomy ultrasound ordered to be scheduled at 18-19 weeks. Pt  is interested in genetic screening. Will get Quad at lab visit on Monday as can not be obtained on Friday afternoon Bleeding and pain precautions reviewed. Follow up 4 weeks.

## 2016-04-22 NOTE — Addendum Note (Signed)
Addended by: Noralee CharsMIKELL, Erika Slaby Z on: 04/22/2016 05:28 PM   Modules accepted: Orders

## 2016-04-22 NOTE — Patient Instructions (Signed)
First Trimester of Pregnancy The first trimester of pregnancy is from week 1 until the end of week 12 (months 1 through 3). A week after a sperm fertilizes an egg, the egg will implant on the wall of the uterus. This embryo will begin to develop into a baby. Genes from you and your partner are forming the baby. The female genes determine whether the baby is a boy or a girl. At 6-8 weeks, the eyes and face are formed, and the heartbeat can be seen on ultrasound. At the end of 12 weeks, all the baby's organs are formed.  Now that you are pregnant, you will want to do everything you can to have a healthy baby. Two of the most important things are to get good prenatal care and to follow your health care provider's instructions. Prenatal care is all the medical care you receive before the baby's birth. This care will help prevent, find, and treat any problems during the pregnancy and childbirth. BODY CHANGES Your body goes through many changes during pregnancy. The changes vary from woman to woman.   You may gain or lose a couple of pounds at first.  You may feel sick to your stomach (nauseous) and throw up (vomit). If the vomiting is uncontrollable, call your health care provider.  You may tire easily.  You may develop headaches that can be relieved by medicines approved by your health care provider.  You may urinate more often. Painful urination may mean you have a bladder infection.  You may develop heartburn as a result of your pregnancy.  You may develop constipation because certain hormones are causing the muscles that push waste through your intestines to slow down.  You may develop hemorrhoids or swollen, bulging veins (varicose veins).  Your breasts may begin to grow larger and become tender. Your nipples may stick out more, and the tissue that surrounds them (areola) may become darker.  Your gums may bleed and may be sensitive to brushing and flossing.  Dark spots or blotches (chloasma,  mask of pregnancy) may develop on your face. This will likely fade after the baby is born.  Your menstrual periods will stop.  You may have a loss of appetite.  You may develop cravings for certain kinds of food.  You may have changes in your emotions from day to day, such as being excited to be pregnant or being concerned that something may go wrong with the pregnancy and baby.  You may have more vivid and strange dreams.  You may have changes in your hair. These can include thickening of your hair, rapid growth, and changes in texture. Some women also have hair loss during or after pregnancy, or hair that feels dry or thin. Your hair will most likely return to normal after your baby is born. WHAT TO EXPECT AT YOUR PRENATAL VISITS During a routine prenatal visit:  You will be weighed to make sure you and the baby are growing normally.  Your blood pressure will be taken.  Your abdomen will be measured to track your baby's growth.  The fetal heartbeat will be listened to starting around week 10 or 12 of your pregnancy.  Test results from any previous visits will be discussed. Your health care provider may ask you:  How you are feeling.  If you are feeling the baby move.  If you have had any abnormal symptoms, such as leaking fluid, bleeding, severe headaches, or abdominal cramping.  If you are using any tobacco products,   including cigarettes, chewing tobacco, and electronic cigarettes.  If you have any questions. Other tests that may be performed during your first trimester include:  Blood tests to find your blood type and to check for the presence of any previous infections. They will also be used to check for low iron levels (anemia) and Rh antibodies. Later in the pregnancy, blood tests for diabetes will be done along with other tests if problems develop.  Urine tests to check for infections, diabetes, or protein in the urine.  An ultrasound to confirm the proper growth  and development of the baby.  An amniocentesis to check for possible genetic problems.  Fetal screens for spina bifida and Down syndrome.  You may need other tests to make sure you and the baby are doing well.  HIV (human immunodeficiency virus) testing. Routine prenatal testing includes screening for HIV, unless you choose not to have this test. HOME CARE INSTRUCTIONS  Medicines  Follow your health care provider's instructions regarding medicine use. Specific medicines may be either safe or unsafe to take during pregnancy.  Take your prenatal vitamins as directed.  If you develop constipation, try taking a stool softener if your health care provider approves. Diet  Eat regular, well-balanced meals. Choose a variety of foods, such as meat or vegetable-based protein, fish, milk and low-fat dairy products, vegetables, fruits, and whole grain breads and cereals. Your health care provider will help you determine the amount of weight gain that is right for you.  Avoid raw meat and uncooked cheese. These carry germs that can cause birth defects in the baby.  Eating four or five small meals rather than three large meals a day may help relieve nausea and vomiting. If you start to feel nauseous, eating a few soda crackers can be helpful. Drinking liquids between meals instead of during meals also seems to help nausea and vomiting.  If you develop constipation, eat more high-fiber foods, such as fresh vegetables or fruit and whole grains. Drink enough fluids to keep your urine clear or pale yellow. Activity and Exercise  Exercise only as directed by your health care provider. Exercising will help you:  Control your weight.  Stay in shape.  Be prepared for labor and delivery.  Experiencing pain or cramping in the lower abdomen or low back is a good sign that you should stop exercising. Check with your health care provider before continuing normal exercises.  Try to avoid standing for long  periods of time. Move your legs often if you must stand in one place for a long time.  Avoid heavy lifting.  Wear low-heeled shoes, and practice good posture.  You may continue to have sex unless your health care provider directs you otherwise. Relief of Pain or Discomfort  Wear a good support bra for breast tenderness.   Take warm sitz baths to soothe any pain or discomfort caused by hemorrhoids. Use hemorrhoid cream if your health care provider approves.   Rest with your legs elevated if you have leg cramps or low back pain.  If you develop varicose veins in your legs, wear support hose. Elevate your feet for 15 minutes, 3-4 times a day. Limit salt in your diet. Prenatal Care  Schedule your prenatal visits by the twelfth week of pregnancy. They are usually scheduled monthly at first, then more often in the last 2 months before delivery.  Write down your questions. Take them to your prenatal visits.  Keep all your prenatal visits as directed by your   health care provider. Safety  Wear your seat belt at all times when driving.  Make a list of emergency phone numbers, including numbers for family, friends, the hospital, and police and fire departments. General Tips  Ask your health care provider for a referral to a local prenatal education class. Begin classes no later than at the beginning of month 6 of your pregnancy.  Ask for help if you have counseling or nutritional needs during pregnancy. Your health care provider can offer advice or refer you to specialists for help with various needs.  Do not use hot tubs, steam rooms, or saunas.  Do not douche or use tampons or scented sanitary pads.  Do not cross your legs for long periods of time.  Avoid cat litter boxes and soil used by cats. These carry germs that can cause birth defects in the baby and possibly loss of the fetus by miscarriage or stillbirth.  Avoid all smoking, herbs, alcohol, and medicines not prescribed by  your health care provider. Chemicals in these affect the formation and growth of the baby.  Do not use any tobacco products, including cigarettes, chewing tobacco, and electronic cigarettes. If you need help quitting, ask your health care provider. You may receive counseling support and other resources to help you quit.  Schedule a dentist appointment. At home, brush your teeth with a soft toothbrush and be gentle when you floss. SEEK MEDICAL CARE IF:   You have dizziness.  You have mild pelvic cramps, pelvic pressure, or nagging pain in the abdominal area.  You have persistent nausea, vomiting, or diarrhea.  You have a bad smelling vaginal discharge.  You have pain with urination.  You notice increased swelling in your face, hands, legs, or ankles. SEEK IMMEDIATE MEDICAL CARE IF:   You have a fever.  You are leaking fluid from your vagina.  You have spotting or bleeding from your vagina.  You have severe abdominal cramping or pain.  You have rapid weight gain or loss.  You vomit blood or material that looks like coffee grounds.  You are exposed to German measles and have never had them.  You are exposed to fifth disease or chickenpox.  You develop a severe headache.  You have shortness of breath.  You have any kind of trauma, such as from a fall or a car accident.   This information is not intended to replace advice given to you by your health care provider. Make sure you discuss any questions you have with your health care provider.   Document Released: 06/28/2001 Document Revised: 07/25/2014 Document Reviewed: 05/14/2013 Elsevier Interactive Patient Education 2016 Elsevier Inc.  

## 2016-04-22 NOTE — Progress Notes (Signed)
[facility], unable to get flu shot here.  Will check with HD. Tymara Saur, Maryjo RochesterJessica Dawn, CMA

## 2016-04-25 ENCOUNTER — Other Ambulatory Visit (INDEPENDENT_AMBULATORY_CARE_PROVIDER_SITE_OTHER): Payer: Self-pay

## 2016-04-25 DIAGNOSIS — O0001 Abdominal pregnancy with intrauterine pregnancy: Secondary | ICD-10-CM

## 2016-04-25 NOTE — Progress Notes (Signed)
[facility] sent to [facility] today. Morse Brueggemann, Rodena Medinobert Lee

## 2016-05-20 ENCOUNTER — Ambulatory Visit (INDEPENDENT_AMBULATORY_CARE_PROVIDER_SITE_OTHER): Payer: Self-pay | Admitting: Internal Medicine

## 2016-05-20 VITALS — BP 98/67 | HR 82 | Temp 98.0°F | Wt 125.2 lb

## 2016-05-20 DIAGNOSIS — Z3492 Encounter for supervision of normal pregnancy, unspecified, second trimester: Secondary | ICD-10-CM

## 2016-05-20 NOTE — Progress Notes (Addendum)
Duard BradyYanyan Metallo is a 30 y.o. G1P0000 at 312w3d for routine follow up.  She reports no concerns. No vaginal bleeding, no leakage of fluid. No significant abdominal pain   See flow sheet for details.  Uterine Size 19.5 cm  FHR: 145 bpm  A/P: Pregnancy at 542w3d.  Doing well.   Pregnancy issues include none  Anatomy scan reviewed, problems are not noted.  Preterm labor precautions reviewed. Follow up 4 weeks.

## 2016-05-20 NOTE — Patient Instructions (Addendum)

## 2016-05-20 NOTE — Progress Notes (Deleted)
[name] is a 30 y.o. G1P0 at 5474w3d for routine follow up.  She reports***.  See flow sheet for details.   A/P: Pregnancy at 9174w3d.  Doing well.   Pregnancy issues include*** Anatomy scan reviewed, problems {are/are not:16769} noted.  Preterm labor precautions reviewed. Follow up 4 weeks.

## 2016-05-26 ENCOUNTER — Encounter: Payer: Self-pay | Admitting: Internal Medicine

## 2016-06-21 ENCOUNTER — Ambulatory Visit (INDEPENDENT_AMBULATORY_CARE_PROVIDER_SITE_OTHER): Payer: Self-pay | Admitting: Internal Medicine

## 2016-06-21 VITALS — BP 92/52 | HR 75 | Temp 97.8°F | Wt 128.8 lb

## 2016-06-21 DIAGNOSIS — Z3482 Encounter for supervision of other normal pregnancy, second trimester: Secondary | ICD-10-CM

## 2016-06-21 NOTE — Progress Notes (Signed)
Duard BradyYanyan Dilone is a 30 y.o. G1P0000 at 4876w0d for routine follow up.  She reports no vaginal bleeding, leakage of fluid or contraction.   Patient has no concerns    Uterine size 24 cm  FHR: 142 bpm    See flow sheet for details.  A/P: Pregnancy at 6476w0d.  Doing well.   Pregnancy issues include None  Childbirth and education classes were not offered. Preterm labor precautions reviewed. Follow up 4weeks.

## 2016-06-21 NOTE — Patient Instructions (Signed)
Second Trimester of Pregnancy The second trimester is from week 13 through week 28, month 4 through 6. This is often the time in pregnancy that you feel your best. Often times, morning sickness has lessened or quit. You may have more energy, and you may get hungry more often. Your unborn baby (fetus) is growing rapidly. At the end of the sixth month, he or she is about 9 inches long and weighs about 1 pounds. You will likely feel the baby move (quickening) between 18 and 20 weeks of pregnancy. Follow these instructions at home:  Avoid all smoking, herbs, and alcohol. Avoid drugs not approved by your doctor.  Do not use any tobacco products, including cigarettes, chewing tobacco, and electronic cigarettes. If you need help quitting, ask your doctor. You may get counseling or other support to help you quit.  Only take medicine as told by your doctor. Some medicines are safe and some are not during pregnancy.  Exercise only as told by your doctor. Stop exercising if you start having cramps.  Eat regular, healthy meals.  Wear a good support bra if your breasts are tender.  Do not use hot tubs, steam rooms, or saunas.  Wear your seat belt when driving.  Avoid raw meat, uncooked cheese, and liter boxes and soil used by cats.  Take your prenatal vitamins.  Take 1500-2000 milligrams of calcium daily starting at the 20th week of pregnancy until you deliver your baby.  Try taking medicine that helps you poop (stool softener) as needed, and if your doctor approves. Eat more fiber by eating fresh fruit, vegetables, and whole grains. Drink enough fluids to keep your pee (urine) clear or pale yellow.  Take warm water baths (sitz baths) to soothe pain or discomfort caused by hemorrhoids. Use hemorrhoid cream if your doctor approves.  If you have puffy, bulging veins (varicose veins), wear support hose. Raise (elevate) your feet for 15 minutes, 3-4 times a day. Limit salt in your diet.  Avoid heavy  lifting, wear low heals, and sit up straight.  Rest with your legs raised if you have leg cramps or low back pain.  Visit your dentist if you have not gone during your pregnancy. Use a soft toothbrush to brush your teeth. Be gentle when you floss.  You can have sex (intercourse) unless your doctor tells you not to.  Go to your doctor visits. Get help if:  You feel dizzy.  You have mild cramps or pressure in your lower belly (abdomen).  You have a nagging pain in your belly area.  You continue to feel sick to your stomach (nauseous), throw up (vomit), or have watery poop (diarrhea).  You have bad smelling fluid coming from your vagina.  You have pain with peeing (urination). Get help right away if:  You have a fever.  You are leaking fluid from your vagina.  You have spotting or bleeding from your vagina.  You have severe belly cramping or pain.  You lose or gain weight rapidly.  You have trouble catching your breath and have chest pain.  You notice sudden or extreme puffiness (swelling) of your face, hands, ankles, feet, or legs.  You have not felt the baby move in over an hour.  You have severe headaches that do not go away with medicine.  You have vision changes. This information is not intended to replace advice given to you by your health care provider. Make sure you discuss any questions you have with your health care   provider. Document Released: 09/28/2009 Document Revised: 12/10/2015 Document Reviewed: 09/04/2012 Elsevier Interactive Patient Education  2017 Elsevier Inc.  

## 2016-07-18 NOTE — L&D Delivery Note (Addendum)
Delivery Note At 5:22 PM a viable female was delivered via Vaginal, Spontaneous Delivery (Presentation OA to LOA;  ).  APGAR: 8, 9; weight 6 lb 6.7 oz (2910 g).  Shoulders delivered with ease; infant placed immediately on maternal abdomen and stimulated.  Placenta status: delivered intact with gentle traction.  Cord: 3 vc with the following complications: none .    Anesthesia:  Epidural Episiotomy: None Lacerations: 2nd degree;Perineal Suture Repair: 2.0 Est. Blood Loss (mL): 400  Mom to postpartum.  Baby to Nursery.  Charlesetta GaribaldiKathryn Lorraine Kooistra CNM 09/26/2016, 6:55 PM

## 2016-07-22 ENCOUNTER — Ambulatory Visit (INDEPENDENT_AMBULATORY_CARE_PROVIDER_SITE_OTHER): Payer: Self-pay | Admitting: Internal Medicine

## 2016-07-22 VITALS — BP 96/64 | HR 67 | Temp 98.2°F | Wt 140.4 lb

## 2016-07-22 DIAGNOSIS — R7309 Other abnormal glucose: Secondary | ICD-10-CM

## 2016-07-22 DIAGNOSIS — Z3483 Encounter for supervision of other normal pregnancy, third trimester: Secondary | ICD-10-CM

## 2016-07-22 DIAGNOSIS — O24419 Gestational diabetes mellitus in pregnancy, unspecified control: Secondary | ICD-10-CM | POA: Insufficient documentation

## 2016-07-22 DIAGNOSIS — R7302 Impaired glucose tolerance (oral): Secondary | ICD-10-CM

## 2016-07-22 LAB — CBC
HCT: 34.4 % — ABNORMAL LOW (ref 35.0–45.0)
HEMOGLOBIN: 11.4 g/dL — AB (ref 11.7–15.5)
MCH: 32.9 pg (ref 27.0–33.0)
MCHC: 33.1 g/dL (ref 32.0–36.0)
MCV: 99.4 fL (ref 80.0–100.0)
MPV: 10.5 fL (ref 7.5–12.5)
Platelets: 157 10*3/uL (ref 140–400)
RBC: 3.46 MIL/uL — AB (ref 3.80–5.10)
RDW: 13.1 % (ref 11.0–15.0)
WBC: 7.6 10*3/uL (ref 3.8–10.8)

## 2016-07-22 LAB — POCT 1 HR PRENATAL GLUCOSE: GLUCOSE 1 HR PRENATAL, POC: 213 mg/dL

## 2016-07-22 NOTE — Patient Instructions (Addendum)
Please return in 2 weeks for your next OB visit.   [facility] [address] to get the [facility] vaccine. Please bring proof of vaccine to your next appointment.

## 2016-07-22 NOTE — Progress Notes (Signed)
[name] is a 31 y.o. G1P0000 at 3075w3d here for routine follow up.  She reports some lower and middle back pain that has worsened over the past few weeks. Bothers her the most at the end of the day and when trying to sleep.  See flow sheet for details.  A/P: Pregnancy at 5175w3d.  Doing well.   Pregnancy issues include None.  BP noted to be low today but upon chart review appears to be patient's baseline and patient is asymptomatic.   Infant feeding choice: Breast Contraception choice: Undecided  Infant circumcision desired: unsure, discussed option for hospital circ vs. Outpatient [facility] circ. Parents want to consider further.   Back pain likely from stress of increasing uterine size. Recommended pregnancy band and tylenol prn.   Tdap was not given today due to insurance reasons. [facility]  1 hour glucola, CBC, RPR, and HIV were done today.   Pregnancy medical home and PHQ-9 forms were done today and reviewed.   Rh status was reviewed and patient does not need Rhogam.  Rhogam was not given today.   1 hr GTT result of 213. Will refer to high risk OB.    Preterm labor and fetal movement precautions reviewed. Follow up 2 weeks.

## 2016-07-23 LAB — HIV ANTIBODY (ROUTINE TESTING W REFLEX): HIV: NONREACTIVE

## 2016-07-23 LAB — RPR

## 2016-07-26 ENCOUNTER — Telehealth: Payer: Self-pay | Admitting: Internal Medicine

## 2016-07-26 NOTE — Telephone Encounter (Signed)
Routing to the doctors that have seen this pt, not sure which one is following this pregnancy, did not see it in the notes section. Lamonte SakaiZimmerman Rumple, Shiasia Porro D, [place]

## 2016-07-26 NOTE — Telephone Encounter (Signed)
pts husband says the high risk clinic didn't receive the referral. Needs to be sent again.  Husband ask for it to be done ASAP because he is very worried about his wife.

## 2016-07-26 NOTE — Telephone Encounter (Signed)
Routing to referral coordinator as well as doctors that saw the pt. I saw it in the referrals still as a new request  Please let me know if I need to do something with this. Lamonte SakaiZimmerman Rumple, Frayda Egley D, [place]

## 2016-07-27 NOTE — Telephone Encounter (Signed)
Referral just placed on 07/22/16. I had not been processed yet. Will send today.

## 2016-07-28 ENCOUNTER — Encounter: Payer: Self-pay | Admitting: Obstetrics & Gynecology

## 2016-08-01 ENCOUNTER — Telehealth: Payer: Self-pay | Admitting: Internal Medicine

## 2016-08-01 NOTE — Telephone Encounter (Signed)
Ok, thanks for letting me know. She can make an appointment if needed to discuss further.

## 2016-08-01 NOTE — Telephone Encounter (Signed)
Husband wanted to let PCP know he believes pt was misdiagnosed about having diabetes. Pt bought a glucose monitor and sugars have been normal. Husband will be calling [facility] to set up a high risk appointment. ep

## 2016-08-08 ENCOUNTER — Encounter: Payer: Self-pay | Attending: Obstetrics & Gynecology | Admitting: *Deleted

## 2016-08-08 ENCOUNTER — Ambulatory Visit: Payer: Self-pay | Admitting: *Deleted

## 2016-08-08 DIAGNOSIS — O24419 Gestational diabetes mellitus in pregnancy, unspecified control: Secondary | ICD-10-CM

## 2016-08-08 DIAGNOSIS — Z713 Dietary counseling and surveillance: Secondary | ICD-10-CM | POA: Insufficient documentation

## 2016-08-08 NOTE — Progress Notes (Signed)
  Patient was seen on 08/08/2016 for Gestational Diabetes self-management . She is here with her husband and an interpretor. The following learning objectives were met by the patient :   States the definition of Gestational Diabetes  States why dietary management is important in controlling blood glucose  Describes the effects of carbohydrates on blood glucose levels  Demonstrates ability to create a balanced meal plan  Demonstrates carbohydrate counting   States when to check blood glucose levels  Demonstrates proper blood glucose monitoring techniques  States the effect of stress and exercise on blood glucose levels  States the importance of limiting caffeine and abstaining from alcohol and smoking  Plan:  Aim for 3 Carb Choices per meal (45 grams) +/- 1 either way  Aim for 1-2 Carbs per snack Begin reading food labels for Total Carbohydrate of foods Consider  increasing your activity level by walking or other activity daily as tolerated Begin checking BG before breakfast and 1-2 hours after first bite of breakfast, lunch and dinner as directed by MD  Take medication if directed by MD  Patient already has a meter: [employer] And is testing pre breakfast and 1 hours each meal as directed by MD Review of Log Book shows: majority of fasting BG below target with a couple at 100 and 104 mg/dl. All post meal BG are under 140 mg/dl at 1 hour after meals.   Patient instructed to monitor glucose levels: FBS: 60 - <90 2 hour: <120 1 hour: <140  Patient received the following handouts:  Nutrition Diabetes and Pregnancy  Carbohydrate Counting List  Patient will be seen for follow-up as needed.

## 2016-08-15 ENCOUNTER — Encounter: Payer: Self-pay | Admitting: Obstetrics & Gynecology

## 2016-08-15 ENCOUNTER — Ambulatory Visit (INDEPENDENT_AMBULATORY_CARE_PROVIDER_SITE_OTHER): Payer: Self-pay | Admitting: Obstetrics & Gynecology

## 2016-08-15 VITALS — BP 102/74 | HR 77 | Wt 137.6 lb

## 2016-08-15 DIAGNOSIS — O0993 Supervision of high risk pregnancy, unspecified, third trimester: Secondary | ICD-10-CM

## 2016-08-15 DIAGNOSIS — O24419 Gestational diabetes mellitus in pregnancy, unspecified control: Secondary | ICD-10-CM

## 2016-08-15 MED ORDER — GLYBURIDE 1.25 MG PO TABS: 2.5000 mg | ORAL_TABLET | Freq: Every day | ORAL | 1 refills | Status: DC

## 2016-08-15 MED FILL — glyBURIDE 1.25 MG TABS: 1.25 | 15 days supply | Qty: 30 | Fill #0

## 2016-08-15 NOTE — Progress Notes (Signed)
   PRENATAL VISIT NOTE  Subjective:  Kara Hull is a 31 y.o. G1P0000 at 25w6dbeing seen today for ongoing prenatal care.  She is currently monitored for the following issues for this high-risk pregnancy and has Ovarian cyst; Abnormal liver enzymes; Pancytopenia ([place]; Encounter for supervision of other normal pregnancy; Abnormal glucose tolerance test (GTT); and Supervision of high risk pregnancy in third trimester on her problem list.  Patient reports no complaints.   .  .  Movement: Present. Denies leaking of fluid.   The following portions of the patient's history were reviewed and updated as appropriate: allergies, current medications, past family history, past medical history, past social history, past surgical history and problem list. Problem list updated.  Objective:   Vitals:   08/15/16 0923  BP: 102/74  Pulse: 77  Weight: 137 lb 9.6 oz (62.4 kg)    Fetal Status:     Movement: Present     General:  Alert, oriented and cooperative. Patient is in no acute distress.  Skin: Skin is warm and dry. No rash noted.   Cardiovascular: Normal heart rate noted  Respiratory: Normal respiratory effort, no problems with respiration noted  Abdomen: Soft, gravid, appropriate for gestational age. Pain/Pressure: Absent     Pelvic:  Cervical exam deferred        Extremities: Normal range of motion.     Mental Status: Normal mood and affect. Normal behavior. Normal judgment and thought content.   Assessment and Plan:  Pregnancy: G1P0000 at 39w6d1. Supervision of high risk pregnancy in third trimester - Urine culture - Comp Met (CMET)  (History of elevated liver enzymes)  2.  GDM-A2 1-most fasting elevated or 89; start glyburide 1.25 mg nightly. 2-Most other values are WNL 3-start 2x week testing. 4-US for growth before delivery (Can send back to health dept for this).  Preterm labor symptoms and general obstetric precautions including but not limited to vaginal bleeding,  contractions, leaking of fluid and fetal movement were reviewed in detail with the patient. Please refer to After Visit Summary for other counseling recommendations.  Return in about 2 weeks (around 08/29/2016).   KeGuss BundeMD

## 2016-08-18 ENCOUNTER — Ambulatory Visit (INDEPENDENT_AMBULATORY_CARE_PROVIDER_SITE_OTHER): Payer: Self-pay | Admitting: *Deleted

## 2016-08-18 VITALS — BP 112/65 | HR 70

## 2016-08-18 DIAGNOSIS — O24419 Gestational diabetes mellitus in pregnancy, unspecified control: Secondary | ICD-10-CM

## 2016-08-29 ENCOUNTER — Other Ambulatory Visit: Payer: Self-pay | Admitting: Obstetrics & Gynecology

## 2016-08-29 ENCOUNTER — Ambulatory Visit: Payer: Self-pay

## 2016-08-29 ENCOUNTER — Ambulatory Visit (INDEPENDENT_AMBULATORY_CARE_PROVIDER_SITE_OTHER): Payer: Self-pay | Admitting: Obstetrics & Gynecology

## 2016-08-29 VITALS — BP 109/68 | HR 71 | Wt 138.0 lb

## 2016-08-29 DIAGNOSIS — O0993 Supervision of high risk pregnancy, unspecified, third trimester: Secondary | ICD-10-CM

## 2016-08-29 DIAGNOSIS — Z3689 Encounter for other specified antenatal screening: Secondary | ICD-10-CM

## 2016-08-29 DIAGNOSIS — R748 Abnormal levels of other serum enzymes: Secondary | ICD-10-CM

## 2016-08-29 DIAGNOSIS — O24419 Gestational diabetes mellitus in pregnancy, unspecified control: Secondary | ICD-10-CM

## 2016-08-29 LAB — COMPREHENSIVE METABOLIC PANEL
ALBUMIN: 3.4 g/dL — AB (ref 3.6–5.1)
ALK PHOS: 142 U/L — AB (ref 33–115)
ALT: 17 U/L (ref 6–29)
AST: 26 U/L (ref 10–30)
BILIRUBIN TOTAL: 0.4 mg/dL (ref 0.2–1.2)
BUN: 19 mg/dL (ref 7–25)
CALCIUM: 8.9 mg/dL (ref 8.6–10.2)
CO2: 21 mmol/L (ref 20–31)
Chloride: 108 mmol/L (ref 98–110)
Creat: 0.92 mg/dL (ref 0.50–1.10)
Glucose, Bld: 106 mg/dL — ABNORMAL HIGH (ref 65–99)
POTASSIUM: 4.1 mmol/L (ref 3.5–5.3)
Sodium: 138 mmol/L (ref 135–146)
TOTAL PROTEIN: 6.2 g/dL (ref 6.1–8.1)

## 2016-08-29 MED FILL — glyBURIDE 1.25 MG TABS: 1.25 | 15 days supply | Qty: 30 | Fill #1

## 2016-08-29 NOTE — Progress Notes (Signed)
Kara Hull reported as normal, NST today is reactive    PRENATAL VISIT NOTE  Subjective:  Kara Hull is a 31 y.o. G1P0000 at 2448w6d being seen today for ongoing prenatal care.  She is currently monitored for the following issues for this high-risk pregnancy and has Ovarian cyst; Abnormal liver enzymes; Pancytopenia (HCC); Encounter for supervision of other normal pregnancy; Abnormal glucose tolerance test (GTT); and Supervision of high risk pregnancy in third trimester on her problem list.  Patient reports no complaints.  Contractions: Irregular. Vag. Bleeding: None.  Movement: Present. Denies leaking of fluid.   The following portions of the patient's history were reviewed and updated as appropriate: allergies, current medications, past family history, past medical history, past social history, past surgical history and problem list. Problem list updated.  Objective:   Vitals:   08/29/16 0902  BP: 109/68  Pulse: 71  Weight: 138 lb (62.6 kg)    Fetal Status: Fetal Heart Rate (bpm): NST   Movement: Present     General:  Alert, oriented and cooperative. Patient is in no acute distress.  Skin: Skin is warm and dry. No rash noted.   Cardiovascular: Normal heart rate noted  Respiratory: Normal respiratory effort, no problems with respiration noted  Abdomen: Soft, gravid, appropriate for gestational age. Pain/Pressure: Present     Pelvic:  Cervical exam deferred        Extremities: Normal range of motion.     Mental Status: Normal mood and affect. Normal behavior. Normal judgment and thought content.   Assessment and Plan:  Pregnancy: G1P0000 at 3248w6d  1. Gestational diabetes mellitus (GDM), antepartum, gestational diabetes method of control unspecified Reactive testing today - Fetal nonstress test - Kara Hull [facility] Limited  2. Supervision of high risk pregnancy in third trimester FBS normal and 90% PP in range, will follow diet closely  - Comprehensive metabolic panel - Culture, OB  Urine  Preterm labor symptoms and general obstetric precautions including but not limited to vaginal bleeding, contractions, leaking of fluid and fetal movement were reviewed in detail with the patient. Please refer to After Visit Summary for other counseling recommendations.  Return in about 4 days (around 09/02/2016) for NST only ;  2/19  Ob fu and NST/AFI.   Adam PhenixJames G Arshad Oberholzer, MD

## 2016-08-29 NOTE — Patient Instructions (Signed)
Third Trimester of Pregnancy The third trimester is from week 29 through week 40 (months 7 through 9). The third trimester is a time when the unborn baby (fetus) is growing rapidly. At the end of the ninth month, the fetus is about 20 inches in length and weighs 6-10 pounds. Body changes during your third trimester Your body goes through many changes during pregnancy. The changes vary from woman to woman. During the third trimester:  Your weight will continue to increase. You can expect to gain 25-35 pounds (11-16 kg) by the end of the pregnancy.  You may begin to get stretch marks on your hips, abdomen, and breasts.  You may urinate more often because the fetus is moving lower into your pelvis and pressing on your bladder.  You may develop or continue to have heartburn. This is caused by increased hormones that slow down muscles in the digestive tract.  You may develop or continue to have constipation because increased hormones slow digestion and cause the muscles that push waste through your intestines to relax.  You may develop hemorrhoids. These are swollen veins (varicose veins) in the rectum that can itch or be painful.  You may develop swollen, bulging veins (varicose veins) in your legs.  You may have increased body aches in the pelvis, back, or thighs. This is due to weight gain and increased hormones that are relaxing your joints.  You may have changes in your hair. These can include thickening of your hair, rapid growth, and changes in texture. Some women also have hair loss during or after pregnancy, or hair that feels dry or thin. Your hair will most likely return to normal after your baby is born.  Your breasts will continue to grow and they will continue to become tender. A yellow fluid (colostrum) may leak from your breasts. This is the first milk you are producing for your baby.  Your belly button may stick out.  You may notice more swelling in your hands, face, or  ankles.  You may have increased tingling or numbness in your hands, arms, and legs. The skin on your belly may also feel numb.  You may feel short of breath because of your expanding uterus.  You may have more problems sleeping. This can be caused by the size of your belly, increased need to urinate, and an increase in your body's metabolism.  You may notice the fetus "dropping," or moving lower in your abdomen.  You may have increased vaginal discharge.  Your cervix becomes thin and soft (effaced) near your due date. What to expect at prenatal visits You will have prenatal exams every 2 weeks until week 36. Then you will have weekly prenatal exams. During a routine prenatal visit:  You will be weighed to make sure you and the fetus are growing normally.  Your blood pressure will be taken.  Your abdomen will be measured to track your baby's growth.  The fetal heartbeat will be listened to.  Any test results from the previous visit will be discussed.  You may have a cervical check near your due date to see if you have effaced. At around 36 weeks, your health care provider will check your cervix. At the same time, your health care provider will also perform a test on the secretions of the vaginal tissue. This test is to determine if a type of bacteria, Group B streptococcus, is present. Your health care provider will explain this further. Your health care provider may ask you:    What your birth plan is.  How you are feeling.  If you are feeling the baby move.  If you have had any abnormal symptoms, such as leaking fluid, bleeding, severe headaches, or abdominal cramping.  If you are using any tobacco products, including cigarettes, chewing tobacco, and electronic cigarettes.  If you have any questions. Other tests or screenings that may be performed during your third trimester include:  Blood tests that check for low iron levels (anemia).  Fetal testing to check the health,  activity level, and growth of the fetus. Testing is done if you have certain medical conditions or if there are problems during the pregnancy.  Nonstress test (NST). This test checks the health of your baby to make sure there are no signs of problems, such as the baby not getting enough oxygen. During this test, a belt is placed around your belly. The baby is made to move, and its heart rate is monitored during movement. What is false labor? False labor is a condition in which you feel small, irregular tightenings of the muscles in the womb (contractions) that eventually go away. These are called Braxton Hicks contractions. Contractions may last for hours, days, or even weeks before true labor sets in. If contractions come at regular intervals, become more frequent, increase in intensity, or become painful, you should see your health care provider. What are the signs of labor?  Abdominal cramps.  Regular contractions that start at 10 minutes apart and become stronger and more frequent with time.  Contractions that start on the top of the uterus and spread down to the lower abdomen and back.  Increased pelvic pressure and dull back pain.  A watery or bloody mucus discharge that comes from the vagina.  Leaking of amniotic fluid. This is also known as your "water breaking." It could be a slow trickle or a gush. Let your doctor know if it has a color or strange odor. If you have any of these signs, call your health care provider right away, even if it is before your due date. Follow these instructions at home: Eating and drinking  Continue to eat regular, healthy meals.  Do not eat:  Raw meat or meat spreads.  Unpasteurized milk or cheese.  Unpasteurized juice.  Store-made salad.  Refrigerated smoked seafood.  Hot dogs or deli meat, unless they are piping hot.  More than 6 ounces of albacore tuna a week.  Shark, swordfish, king mackerel, or tile fish.  Store-made salads.  Raw  sprouts, such as mung bean or alfalfa sprouts.  Take prenatal vitamins as told by your health care provider.  Take 1000 mg of calcium daily as told by your health care provider.  If you develop constipation:  Take over-the-counter or prescription medicines.  Drink enough fluid to keep your urine clear or pale yellow.  Eat foods that are high in fiber, such as fresh fruits and vegetables, whole grains, and beans.  Limit foods that are high in fat and processed sugars, such as fried and sweet foods. Activity  Exercise only as directed by your health care provider. Healthy pregnant women should aim for 2 hours and 30 minutes of moderate exercise per week. If you experience any pain or discomfort while exercising, stop.  Avoid heavy lifting.  Do not exercise in extreme heat or humidity, or at high altitudes.  Wear low-heel, comfortable shoes.  Practice good posture.  Do not travel far distances unless it is absolutely necessary and only with the approval   of your health care provider.  Wear your seat belt at all times while in a car, on a bus, or on a plane.  Take frequent breaks and rest with your legs elevated if you have leg cramps or low back pain.  Do not use hot tubs, steam rooms, or saunas.  You may continue to have sex unless your health care provider tells you otherwise. Lifestyle  Do not use any products that contain nicotine or tobacco, such as cigarettes and e-cigarettes. If you need help quitting, ask your health care provider.  Do not drink alcohol.  Do not use any medicinal herbs or unprescribed drugs. These chemicals affect the formation and growth of the baby.  If you develop varicose veins:  Wear support pantyhose or compression stockings as told by your healthcare provider.  Elevate your feet for 15 minutes, 3-4 times a day.  Wear a supportive maternity bra to help with breast tenderness. General instructions  Take over-the-counter and prescription  medicines only as told by your health care provider. There are medicines that are either safe or unsafe to take during pregnancy.  Take warm sitz baths to soothe any pain or discomfort caused by hemorrhoids. Use hemorrhoid cream or witch hazel if your health care provider approves.  Avoid cat litter boxes and soil used by cats. These carry germs that can cause birth defects in the baby. If you have a cat, ask someone to clean the litter box for you.  To prepare for the arrival of your baby:  Take prenatal classes to understand, practice, and ask questions about the labor and delivery.  Make a trial run to the hospital.  Visit the hospital and tour the maternity area.  Arrange for maternity or paternity leave through employers.  Arrange for family and friends to take care of pets while you are in the hospital.  Purchase a rear-facing car seat and make sure you know how to install it in your car.  Pack your hospital bag.  Prepare the baby's nursery. Make sure to remove all pillows and stuffed animals from the baby's crib to prevent suffocation.  Visit your dentist if you have not gone during your pregnancy. Use a soft toothbrush to brush your teeth and be gentle when you floss.  Keep all prenatal follow-up visits as told by your health care provider. This is important. Contact a health care provider if:  You are unsure if you are in labor or if your water has broken.  You become dizzy.  You have mild pelvic cramps, pelvic pressure, or nagging pain in your abdominal area.  You have lower back pain.  You have persistent nausea, vomiting, or diarrhea.  You have an unusual or bad smelling vaginal discharge.  You have pain when you urinate. Get help right away if:  You have a fever.  You are leaking fluid from your vagina.  You have spotting or bleeding from your vagina.  You have severe abdominal pain or cramping.  You have rapid weight loss or weight gain.  You have  shortness of breath with chest pain.  You notice sudden or extreme swelling of your face, hands, ankles, feet, or legs.  Your baby makes fewer than 10 movements in 2 hours.  You have severe headaches that do not go away with medicine.  You have vision changes. Summary  The third trimester is from week 29 through week 40, months 7 through 9. The third trimester is a time when the unborn baby (fetus)   is growing rapidly.  During the third trimester, your discomfort may increase as you and your baby continue to gain weight. You may have abdominal, leg, and back pain, sleeping problems, and an increased need to urinate.  During the third trimester your breasts will keep growing and they will continue to become tender. A yellow fluid (colostrum) may leak from your breasts. This is the first milk you are producing for your baby.  False labor is a condition in which you feel small, irregular tightenings of the muscles in the womb (contractions) that eventually go away. These are called Braxton Hicks contractions. Contractions may last for hours, days, or even weeks before true labor sets in.  Signs of labor can include: abdominal cramps; regular contractions that start at 10 minutes apart and become stronger and more frequent with time; watery or bloody mucus discharge that comes from the vagina; increased pelvic pressure and dull back pain; and leaking of amniotic fluid. This information is not intended to replace advice given to you by your health care provider. Make sure you discuss any questions you have with your health care provider. Document Released: 06/28/2001 Document Revised: 12/10/2015 Document Reviewed: 09/04/2012 Elsevier Interactive Patient Education  2017 [employer]  

## 2016-08-29 NOTE — Progress Notes (Signed)
Pt informed that the ultrasound is considered a limited OB ultrasound and is not intended to be a complete ultrasound exam.  Patient also informed that the ultrasound is not being completed with the intent of assessing for fetal or placental anomalies or any pelvic abnormalities.  Explained that the purpose of today's ultrasound is to assess for presentation and amniotic fluid volume.  Patient acknowledges the purpose of the exam and the limitations of the study.    [employer] video interpreter Cordelia PenSherry # [phone] used for encounter. Pt had US for growth  and BPP @ [facility] on 2/5 - results scanned to media.

## 2016-08-31 ENCOUNTER — Ambulatory Visit: Payer: Self-pay | Admitting: *Deleted

## 2016-08-31 LAB — CULTURE, OB URINE
COLONY COUNT: NO GROWTH
ORGANISM ID, BACTERIA: NO GROWTH

## 2016-09-02 ENCOUNTER — Ambulatory Visit (INDEPENDENT_AMBULATORY_CARE_PROVIDER_SITE_OTHER): Payer: Self-pay | Admitting: Obstetrics & Gynecology

## 2016-09-02 VITALS — BP 116/73 | HR 63

## 2016-09-02 DIAGNOSIS — O24419 Gestational diabetes mellitus in pregnancy, unspecified control: Secondary | ICD-10-CM

## 2016-09-05 ENCOUNTER — Ambulatory Visit (INDEPENDENT_AMBULATORY_CARE_PROVIDER_SITE_OTHER): Payer: Self-pay | Admitting: Obstetrics & Gynecology

## 2016-09-05 ENCOUNTER — Ambulatory Visit: Payer: Self-pay

## 2016-09-05 VITALS — BP 118/75 | HR 56 | Wt 140.0 lb

## 2016-09-05 DIAGNOSIS — Z3483 Encounter for supervision of other normal pregnancy, third trimester: Secondary | ICD-10-CM

## 2016-09-05 DIAGNOSIS — O24419 Gestational diabetes mellitus in pregnancy, unspecified control: Secondary | ICD-10-CM

## 2016-09-05 DIAGNOSIS — Z3689 Encounter for other specified antenatal screening: Secondary | ICD-10-CM

## 2016-09-05 DIAGNOSIS — O0993 Supervision of high risk pregnancy, unspecified, third trimester: Secondary | ICD-10-CM

## 2016-09-05 LAB — OB RESULTS CONSOLE GBS: GBS: NEGATIVE

## 2016-09-05 NOTE — Addendum Note (Signed)
Addended by: Gerome ApleyZEYFANG, Nariya Neumeyer L on: 09/05/2016 03:49 PM   Modules accepted: Orders

## 2016-09-05 NOTE — Progress Notes (Signed)
Pt informed that the ultrasound is considered a limited OB ultrasound and is not intended to be a complete ultrasound exam.  Patient also informed that the ultrasound is not being completed with the intent of assessing for fetal or placental anomalies or any pelvic abnormalities.  Explained that the purpose of today's ultrasound is to assess for presentation and amniotic fluid volume.  Patient acknowledges the purpose of the exam and the limitations of the study.      PRENATAL VISIT NOTE  Subjective:  Kara Hull is a 31 y.o. G1P0000 at 7071w6d being seen today for ongoing prenatal care.  She is currently monitored for the following issues for this high-risk pregnancy and has Ovarian cyst; Encounter for supervision of other normal pregnancy; Gestational diabetes mellitus (GDM); and Supervision of high risk pregnancy in third trimester on her problem list.  Patient reports burning on external labia to pubic symphysis..  Contractions: Irregular. Vag. Bleeding: None.  Movement: Present. Denies leaking of fluid.   The following portions of the patient's history were reviewed and updated as appropriate: allergies, current medications, past family history, past medical history, past social history, past surgical history and problem list. Problem list updated.  Objective:   Vitals:   09/05/16 0843  BP: 118/75  Pulse: (!) 56  Weight: 140 lb (63.5 kg)    Fetal Status: Fetal Heart Rate (bpm): NST   Movement: Present  Presentation: Vertex  General:  Alert, oriented and cooperative. Patient is in no acute distress.  Skin: Skin is warm and dry. No rash noted.   Cardiovascular: Normal heart rate noted  Respiratory: Normal respiratory effort, no problems with respiration noted  Abdomen: Soft, gravid, appropriate for gestational age. Pain/Pressure: Present     Pelvic:  Cervical exam deferred        Extremities: Normal range of motion.     Mental Status: Normal mood and affect. Normal behavior. Normal  judgment and thought content.   Assessment and Plan:  Pregnancy: G1P0000 at 6771w6d  1. Gestational diabetes mellitus (GDM), antepartum, gestational diabetes method of control unspecified - Fetal nonstress test - [facility] OB Limited--at health dept at 38.5 weeks.  Last growth 40%.  BPP that day as well then finsihed with testing.  2. Supervision of high risk pregnancy in third trimester GBS today  3.  Normal external genitalia.  No lesion.  Sensation occurs while walking.  Most likely position of baby head on nerve.    Preterm labor symptoms and general obstetric precautions including but not limited to vaginal bleeding, contractions, leaking of fluid and fetal movement were reviewed in detail with the patient. Please refer to After Visit Summary for other counseling recommendations.  Return in about 1 week (around 09/12/2016) for Ob fu and NST/AFI.   Lesly DukesKelly H Scottlyn Mchaney, MD

## 2016-09-07 NOTE — Progress Notes (Signed)
Reactive NST 

## 2016-09-09 ENCOUNTER — Ambulatory Visit (INDEPENDENT_AMBULATORY_CARE_PROVIDER_SITE_OTHER): Payer: Self-pay | Admitting: *Deleted

## 2016-09-09 VITALS — BP 113/72 | HR 87

## 2016-09-09 DIAGNOSIS — O24419 Gestational diabetes mellitus in pregnancy, unspecified control: Secondary | ICD-10-CM

## 2016-09-09 LAB — CULTURE, BETA STREP (GROUP B ONLY): Strep Gp B Culture: NEGATIVE

## 2016-09-09 NOTE — Progress Notes (Signed)
NST Note Date: 09/09/2016 Gestational Age: 31/3 FHT: 145 baseline, positive accelerations, negative, deceleration, moderate variability Toco: +irritability Time: 25 minutes  A/P: rNST. Continue with current plan of care.   Cornelia Copaharlie Sabriah Hobbins, Jr MD Attending [facility](Faculty Practice)

## 2016-09-12 ENCOUNTER — Ambulatory Visit (INDEPENDENT_AMBULATORY_CARE_PROVIDER_SITE_OTHER): Payer: Self-pay | Admitting: Family Medicine

## 2016-09-12 ENCOUNTER — Ambulatory Visit: Payer: Self-pay

## 2016-09-12 VITALS — BP 118/82 | HR 67 | Wt 139.0 lb

## 2016-09-12 DIAGNOSIS — Z113 Encounter for screening for infections with a predominantly sexual mode of transmission: Secondary | ICD-10-CM

## 2016-09-12 DIAGNOSIS — O24419 Gestational diabetes mellitus in pregnancy, unspecified control: Secondary | ICD-10-CM

## 2016-09-12 DIAGNOSIS — Z3689 Encounter for other specified antenatal screening: Secondary | ICD-10-CM

## 2016-09-12 DIAGNOSIS — O0993 Supervision of high risk pregnancy, unspecified, third trimester: Secondary | ICD-10-CM

## 2016-09-12 LAB — POCT URINALYSIS DIP (DEVICE)
BILIRUBIN URINE: NEGATIVE
Glucose, UA: NEGATIVE mg/dL
Hgb urine dipstick: NEGATIVE
Ketones, ur: NEGATIVE mg/dL
NITRITE: NEGATIVE
PROTEIN: NEGATIVE mg/dL
Specific Gravity, Urine: <=1.005 (ref 1.005–1.030)
UROBILINOGEN UA: 0.2 mg/dL (ref 0.0–1.0)
pH: 5 (ref 5.0–8.0)

## 2016-09-12 NOTE — Progress Notes (Signed)
Subjective:  Kara BradyYanyan Hull is a 31 y.o. G1P0000 at 949w6d being seen today for ongoing prenatal care.  She is currently monitored for the following issues for this high-risk pregnancy and has Ovarian cyst; Encounter for supervision of other normal pregnancy; Gestational diabetes mellitus (GDM); and Supervision of high risk pregnancy in third trimester on her problem list.  GDM: Patient taking Glyburide 2.5mg  at bedtime.  Reports no hypoglycemic episodes.  Tolerating medication well Fasting: controlled 2hr PP: controlled  Patient reports no complaints.  Contractions: Irregular. Vag. Bleeding: None.  Movement: Present. Denies leaking of fluid.   The following portions of the patient's history were reviewed and updated as appropriate: allergies, current medications, past family history, past medical history, past social history, past surgical history and problem list. Problem list updated.  Objective:   Vitals:   09/12/16 1501  BP: 118/82  Pulse: 67  Weight: 139 lb (63 kg)    Fetal Status: Fetal Heart Rate (bpm): NST   Movement: Present     General:  Alert, oriented and cooperative. Patient is in no acute distress.  Skin: Skin is warm and dry. No rash noted.   Cardiovascular: Normal heart rate noted  Respiratory: Normal respiratory effort, no problems with respiration noted  Abdomen: Soft, gravid, appropriate for gestational age. Pain/Pressure: Present     Pelvic: Vag. Bleeding: None     Cervical exam deferred        Extremities: Normal range of motion.     Mental Status: Normal mood and affect. Normal behavior. Normal judgment and thought content.   Urinalysis:      Assessment and Plan:  Pregnancy: G1P0000 at 5049w6d  1. Gestational diabetes mellitus (GDM), antepartum, gestational diabetes method of control unspecified NST reactive.  Continue twice weekly testing  - Fetal nonstress test - US OB Limited  2. Supervision of high risk pregnancy in third trimester FHT and FH  normal. - GC/Chlamydia probe amp ([place])[facility]  Preterm labor symptoms and general obstetric precautions including but not limited to vaginal bleeding, contractions, leaking of fluid and fetal movement were reviewed in detail with the patient. Please refer to After Visit Summary for other counseling recommendations.  Return in about 7 days (around 09/19/2016) for 3/5 or 3/6  Ob fu and NST/AFI.   Levie HeritageJacob J Stinson, DO

## 2016-09-13 LAB — GC/CHLAMYDIA PROBE AMP (~~LOC~~) NOT AT ARMC
Chlamydia: NEGATIVE
Neisseria Gonorrhea: NEGATIVE

## 2016-09-15 ENCOUNTER — Other Ambulatory Visit: Payer: Self-pay | Admitting: Obstetrics and Gynecology

## 2016-09-16 ENCOUNTER — Ambulatory Visit (INDEPENDENT_AMBULATORY_CARE_PROVIDER_SITE_OTHER): Payer: Self-pay | Admitting: Obstetrics and Gynecology

## 2016-09-16 VITALS — BP 127/82 | HR 60

## 2016-09-16 DIAGNOSIS — O0993 Supervision of high risk pregnancy, unspecified, third trimester: Secondary | ICD-10-CM

## 2016-09-16 DIAGNOSIS — O24419 Gestational diabetes mellitus in pregnancy, unspecified control: Secondary | ICD-10-CM

## 2016-09-16 MED ORDER — GLYBURIDE 1.25 MG PO TABS: 2.5000 mg | ORAL_TABLET | Freq: Every day | ORAL | 0 refills | Status: DC

## 2016-09-16 MED FILL — glyBURIDE 1.25 MG TABS: 1.25 | 11 days supply | Qty: 22 | Fill #0

## 2016-09-16 NOTE — Progress Notes (Signed)
US for growth and BPP @ [facility] on 3/5.  IOL scheduled 3/13 @ midnight

## 2016-09-18 NOTE — Progress Notes (Signed)
Reactive NST 

## 2016-09-19 ENCOUNTER — Encounter (HOSPITAL_COMMUNITY): Payer: Self-pay | Admitting: *Deleted

## 2016-09-19 ENCOUNTER — Telehealth (HOSPITAL_COMMUNITY): Payer: Self-pay | Admitting: *Deleted

## 2016-09-19 NOTE — Telephone Encounter (Signed)
Preadmission screen [phone] interpreter number

## 2016-09-21 ENCOUNTER — Other Ambulatory Visit: Payer: Self-pay | Admitting: Advanced Practice Midwife

## 2016-09-23 ENCOUNTER — Ambulatory Visit (INDEPENDENT_AMBULATORY_CARE_PROVIDER_SITE_OTHER): Payer: Self-pay | Admitting: Obstetrics and Gynecology

## 2016-09-23 ENCOUNTER — Other Ambulatory Visit: Payer: Self-pay | Admitting: Obstetrics and Gynecology

## 2016-09-23 VITALS — BP 120/85 | HR 68 | Wt 140.2 lb

## 2016-09-23 DIAGNOSIS — L299 Pruritus, unspecified: Secondary | ICD-10-CM | POA: Insufficient documentation

## 2016-09-23 DIAGNOSIS — O0993 Supervision of high risk pregnancy, unspecified, third trimester: Secondary | ICD-10-CM

## 2016-09-23 DIAGNOSIS — O24419 Gestational diabetes mellitus in pregnancy, unspecified control: Secondary | ICD-10-CM

## 2016-09-23 DIAGNOSIS — Z789 Other specified health status: Secondary | ICD-10-CM | POA: Insufficient documentation

## 2016-09-23 NOTE — Progress Notes (Signed)
Pt reports decreased FM x1 week - weaker but same frequency. She also reports itching of abdomen - light pink rash noted - denies itching of palms of hands and soles of feet.  Pt had US for growth @ [facility] on 3/5. IOL scheduled 3/14 @ 0630.

## 2016-09-23 NOTE — Progress Notes (Addendum)
See OB H&P

## 2016-09-23 NOTE — H&P (Addendum)
Obstetrics Admission History & Physical  09/23/2016 - 11:31 AM Primary [facility]: [facility]  Chief Complaint: IOL for GDMa2  History of Present Illness  31 y.o. G1 @ 6266w3d, with the above CC. Pregnancy complicated by: GDMa2 (qhs glyburide)  Kara Hull states that she had some decreased FM the other day and has some belly itching; no palmar or sole s/s.   Review of Systems: as noted in the History of Present Illness.  PMHx:  Past Medical History:  Diagnosis Date  . Gestational diabetes    glyburide  . Gestational diabetes mellitus (GDM)   . Right ovarian cyst   . Uterine polyp    PSHx:  Past Surgical History:  Procedure Laterality Date  . CYSTECTOMY Right 2016  . UTERINE FIBROID SURGERY  2016   uterine "polyps"   Medications: PNV, glyburide 1.25 qhs.   Allergies: has No Known Allergies. OBHx:  OB History  Gravida Para Term Preterm AB Living  1   0 0 0 0  SAB TAB Ectopic Multiple Live Births               # Outcome Date GA Lbr Len/2nd Weight Sex Delivery Anes PTL Lv  1 Current               GYNHx:  History of STIs: No..             FHx:  Family History  Problem Relation Age of Onset  . Diabetes Mother   . Cancer Maternal Grandmother     Lung  . Diabetes Mellitus II Maternal Grandfather    Soc Hx:  Social History   Social History  . Marital status: Married    Spouse name: N/A  . Number of children: N/A  . Years of education: N/A   Occupational History  . Not on file.   Social History Main Topics  . Smoking status: Never Smoker  . Smokeless tobacco: Never Used  . Alcohol use No  . Drug use: No  . Sexual activity: Yes   Other Topics Concern  . Not on file   Social History Narrative  . No narrative on file    Objective  120/85 68 FHR: rNST FH 38  EFM: 140 baseline, +accels, no decel, mod variability  Toco: q4856m  General: Well nourished, well developed female in no acute distress.  Skin:  Warm and dry. subcm  spots on abdomen. Arms and legs, hands, normal.  Cardiovascular: S1, S2 normal, no murmur, rub or gallop, regular rate and rhythm Respiratory:  Clear to auscultation bilateral. Normal respiratory effort Abdomen: gravid, nttp Neuro/Psych:  Normal mood and affect.   SVE: 2/50/-2/soft/anterior; pelvis feels adquate Le[place]olds/EFW: cephalic, 3500gm  Labs  No new labs Normal BS log today  Radiology Pt [facility].   Perinatal info  O pos/ Rubella  Immune / Varicella Unknown/RPR neg/HIV negative/HepB Surf Ag negative/TDaP:needed /pap 06-2015/  Assessment & Plan   31 y.o. G1P0000 @ 6666w3d and doing well *Pregnancy: routine care *GDMa2: continue current regimen. rNST today. Set up for IOL on 3/14. F/u growth scan from [facility] (RNs working on getting results). Normal growth scan early 08/2016. Admit CGB and q4h in latent and q1-2h in active *IOL: likely pitocin *Itching: [facility] precautions given. BA and CMP today. If +would induce earlier *Interpreter used *GBS: neg  Cornelia Copaharlie Seth Friedlander, Jr. MD Attending [facility](Faculty Practice)

## 2016-09-25 LAB — COMPREHENSIVE METABOLIC PANEL
ALT: 13 IU/L (ref 0–32)
AST: 21 IU/L (ref 0–40)
Albumin/Globulin Ratio: 1.3 (ref 1.2–2.2)
Albumin: 3.7 g/dL (ref 3.5–5.5)
Alkaline Phosphatase: 225 IU/L — ABNORMAL HIGH (ref 39–117)
BILIRUBIN TOTAL: 0.2 mg/dL (ref 0.0–1.2)
BUN/Creatinine Ratio: 20 (ref 9–23)
BUN: 20 mg/dL (ref 6–20)
CHLORIDE: 103 mmol/L (ref 96–106)
CO2: 20 mmol/L (ref 18–29)
CREATININE: 1 mg/dL (ref 0.57–1.00)
Calcium: 9.1 mg/dL (ref 8.7–10.2)
GFR calc non Af Amer: 76 mL/min/{1.73_m2} (ref >59)
GFR, EST AFRICAN AMERICAN: 87 mL/min/{1.73_m2} (ref >59)
GLUCOSE: 83 mg/dL (ref 65–99)
Globulin, Total: 2.9 g/dL (ref 1.5–4.5)
Potassium: 4.3 mmol/L (ref 3.5–5.2)
Sodium: 138 mmol/L (ref 134–144)
Total Protein: 6.6 g/dL (ref 6.0–8.5)

## 2016-09-25 LAB — BILE ACIDS, TOTAL: Bile Acids Total: 9.6 umol/L (ref 4.7–24.5)

## 2016-09-26 ENCOUNTER — Ambulatory Visit: Payer: Self-pay

## 2016-09-26 ENCOUNTER — Inpatient Hospital Stay (HOSPITAL_COMMUNITY): Payer: Medicaid Other | Admitting: Anesthesiology

## 2016-09-26 ENCOUNTER — Ambulatory Visit (INDEPENDENT_AMBULATORY_CARE_PROVIDER_SITE_OTHER): Payer: Self-pay | Admitting: Obstetrics and Gynecology

## 2016-09-26 ENCOUNTER — Inpatient Hospital Stay (HOSPITAL_COMMUNITY)
Admission: AD | Admit: 2016-09-26 | Discharge: 2016-09-28 | DRG: 774 | Disposition: A | Discharge status: Home / Self Care | Payer: Medicaid Other | Source: Ambulatory Visit | Attending: Obstetrics & Gynecology | Admitting: Obstetrics & Gynecology

## 2016-09-26 ENCOUNTER — Inpatient Hospital Stay (HOSPITAL_COMMUNITY): Admission: RE | Admit: 2016-09-26 | Payer: Self-pay | Source: Ambulatory Visit | Admitting: Obstetrics and Gynecology

## 2016-09-26 ENCOUNTER — Encounter (HOSPITAL_COMMUNITY): Payer: Self-pay | Admitting: *Deleted

## 2016-09-26 DIAGNOSIS — D6959 Other secondary thrombocytopenia: Secondary | ICD-10-CM | POA: Diagnosis present

## 2016-09-26 DIAGNOSIS — O24429 Gestational diabetes mellitus in childbirth, unspecified control: Secondary | ICD-10-CM | POA: Diagnosis not present

## 2016-09-26 DIAGNOSIS — O24425 Gestational diabetes mellitus in childbirth, controlled by oral hypoglycemic drugs: Principal | ICD-10-CM | POA: Diagnosis present

## 2016-09-26 DIAGNOSIS — Z3A38 38 weeks gestation of pregnancy: Secondary | ICD-10-CM

## 2016-09-26 DIAGNOSIS — O10013 Pre-existing essential hypertension complicating pregnancy, third trimester: Secondary | ICD-10-CM | POA: Diagnosis present

## 2016-09-26 DIAGNOSIS — O9912 Other diseases of the blood and blood-forming organs and certain disorders involving the immune mechanism complicating childbirth: Secondary | ICD-10-CM | POA: Diagnosis present

## 2016-09-26 DIAGNOSIS — O24419 Gestational diabetes mellitus in pregnancy, unspecified control: Secondary | ICD-10-CM

## 2016-09-26 DIAGNOSIS — D696 Thrombocytopenia, unspecified: Secondary | ICD-10-CM

## 2016-09-26 DIAGNOSIS — O99119 Other diseases of the blood and blood-forming organs and certain disorders involving the immune mechanism complicating pregnancy, unspecified trimester: Secondary | ICD-10-CM

## 2016-09-26 DIAGNOSIS — Z3493 Encounter for supervision of normal pregnancy, unspecified, third trimester: Secondary | ICD-10-CM | POA: Diagnosis present

## 2016-09-26 DIAGNOSIS — Z3689 Encounter for other specified antenatal screening: Secondary | ICD-10-CM

## 2016-09-26 LAB — COMPREHENSIVE METABOLIC PANEL
ALT: 13 U/L — AB (ref 14–54)
AST: 24 U/L (ref 15–41)
Albumin: 3.1 g/dL — ABNORMAL LOW (ref 3.5–5.0)
Alkaline Phosphatase: 221 U/L — ABNORMAL HIGH (ref 38–126)
Anion gap: 9 (ref 5–15)
BILIRUBIN TOTAL: 0.1 mg/dL — AB (ref 0.3–1.2)
BUN: 24 mg/dL — AB (ref 6–20)
CALCIUM: 9.3 mg/dL (ref 8.9–10.3)
CHLORIDE: 110 mmol/L (ref 101–111)
CO2: 18 mmol/L — ABNORMAL LOW (ref 22–32)
CREATININE: 1.03 mg/dL — AB (ref 0.44–1.00)
Glucose, Bld: 91 mg/dL (ref 65–99)
Potassium: 4.1 mmol/L (ref 3.5–5.1)
Sodium: 137 mmol/L (ref 135–145)
TOTAL PROTEIN: 6.5 g/dL (ref 6.5–8.1)

## 2016-09-26 LAB — GLUCOSE, CAPILLARY
GLUCOSE-CAPILLARY: 74 mg/dL (ref 65–99)
GLUCOSE-CAPILLARY: 68 mg/dL (ref 65–99)
Glucose-Capillary: 74 mg/dL (ref 65–99)
Glucose-Capillary: 103 mg/dL — ABNORMAL HIGH (ref 65–99)

## 2016-09-26 LAB — ABO/RH: ABO/RH(D): O POS

## 2016-09-26 LAB — CBC
HCT: 37.2 % (ref 36.0–46.0)
HEMATOCRIT: 38.7 % (ref 36.0–46.0)
Hemoglobin: 13.5 g/dL (ref 12.0–15.0)
Hemoglobin: 12.9 g/dL (ref 12.0–15.0)
MCH: 33.6 pg (ref 26.0–34.0)
MCH: 33.6 pg (ref 26.0–34.0)
MCHC: 34.9 g/dL (ref 30.0–36.0)
MCHC: 34.7 g/dL (ref 30.0–36.0)
MCV: 96.3 fL (ref 78.0–100.0)
MCV: 96.9 fL (ref 78.0–100.0)
PLATELETS: 101 10*3/uL — AB (ref 150–400)
PLATELETS: 96 10*3/uL — AB (ref 150–400)
RBC: 4.02 MIL/uL (ref 3.87–5.11)
RBC: 3.84 MIL/uL — AB (ref 3.87–5.11)
RDW: 12.1 % (ref 11.5–15.5)
RDW: 12.3 % (ref 11.5–15.5)
WBC: 5.7 10*3/uL (ref 4.0–10.5)
WBC: 8.8 10*3/uL (ref 4.0–10.5)

## 2016-09-26 LAB — TYPE AND SCREEN
ABO/RH(D): O POS
ANTIBODY SCREEN: NEGATIVE

## 2016-09-26 LAB — PROTEIN / CREATININE RATIO, URINE
Creatinine, Urine: 40 mg/dL
Protein Creatinine Ratio: 0.18 mg/mg{Cre} — ABNORMAL HIGH (ref 0.00–0.15)
Total Protein, Urine: 7 mg/dL

## 2016-09-26 MED ORDER — DIPHENHYDRAMINE HCL 50 MG/ML IJ SOLN: 12.5000 mg | INTRAMUSCULAR | Status: DC | PRN

## 2016-09-26 MED ORDER — DIPHENHYDRAMINE HCL 25 MG PO CAPS: 25.0000 mg | ORAL_CAPSULE | Freq: Four times a day (QID) | ORAL | Status: DC | PRN

## 2016-09-26 MED ORDER — LACTATED RINGERS IV SOLN: INTRAVENOUS | Status: DC

## 2016-09-26 MED ORDER — LIDOCAINE HCL (PF) 1 % IJ SOLN
INTRAMUSCULAR | Status: DC | PRN
  Administered 2016-09-26: 5 mL
  Administered 2016-09-26: 3 mL
  Administered 2016-09-26: 2 mL

## 2016-09-26 MED ORDER — SIMETHICONE 80 MG PO CHEW: 80.0000 mg | CHEWABLE_TABLET | ORAL | Status: DC | PRN

## 2016-09-26 MED ORDER — FENTANYL CITRATE (PF) 100 MCG/2ML IJ SOLN: 50.0000 ug | INTRAMUSCULAR | Status: DC | PRN

## 2016-09-26 MED ORDER — OXYTOCIN BOLUS FROM INFUSION: 500.0000 mL | Freq: Once | INTRAVENOUS | Status: DC

## 2016-09-26 MED ORDER — PHENYLEPHRINE 40 MCG/ML (10ML) SYRINGE FOR IV PUSH (FOR BLOOD PRESSURE SUPPORT)
80.0000 ug | PREFILLED_SYRINGE | INTRAVENOUS | Status: DC | PRN
  Filled 2016-09-26: qty 5

## 2016-09-26 MED ORDER — LIDOCAINE HCL (PF) 1 % IJ SOLN
30.0000 mL | INTRAMUSCULAR | Status: DC | PRN
  Filled 2016-09-26: qty 30

## 2016-09-26 MED ORDER — BUPIVACAINE HCL (PF) 0.25 % IJ SOLN
INTRAMUSCULAR | Status: DC | PRN
  Administered 2016-09-26 (×2): 5 mL via EPIDURAL

## 2016-09-26 MED ORDER — FENTANYL CITRATE (PF) 100 MCG/2ML IJ SOLN: 100.0000 ug | INTRAMUSCULAR | Status: DC | PRN

## 2016-09-26 MED ORDER — EPHEDRINE 5 MG/ML INJ
10.0000 mg | INTRAVENOUS | Status: DC | PRN
  Filled 2016-09-26: qty 4

## 2016-09-26 MED ORDER — ONDANSETRON HCL 4 MG/2ML IJ SOLN: 4.0000 mg | Freq: Four times a day (QID) | INTRAMUSCULAR | Status: DC | PRN

## 2016-09-26 MED ORDER — PHENYLEPHRINE 40 MCG/ML (10ML) SYRINGE FOR IV PUSH (FOR BLOOD PRESSURE SUPPORT): 80.0000 ug | PREFILLED_SYRINGE | INTRAVENOUS | Status: DC | PRN

## 2016-09-26 MED ORDER — TERBUTALINE SULFATE 1 MG/ML IJ SOLN: 0.2500 mg | Freq: Once | INTRAMUSCULAR | Status: DC | PRN

## 2016-09-26 MED ORDER — LACTATED RINGERS IV SOLN: 500.0000 mL | INTRAVENOUS | Status: DC | PRN

## 2016-09-26 MED ORDER — LIDOCAINE HCL (PF) 1 % IJ SOLN: 30.0000 mL | INTRAMUSCULAR | Status: DC | PRN

## 2016-09-26 MED ORDER — WITCH HAZEL-GLYCERIN EX PADS: 1.0000 "application " | MEDICATED_PAD | CUTANEOUS | Status: DC | PRN

## 2016-09-26 MED ORDER — BENZOCAINE-MENTHOL 20-0.5 % EX AERO: 1.0000 "application " | INHALATION_SPRAY | CUTANEOUS | Status: DC | PRN

## 2016-09-26 MED ORDER — EPHEDRINE 5 MG/ML INJ: 10.0000 mg | INTRAVENOUS | Status: DC | PRN

## 2016-09-26 MED ORDER — FENTANYL 2.5 MCG/ML BUPIVACAINE 1/10 % EPIDURAL INFUSION (WH - ANES)
14.0000 mL/h | INTRAMUSCULAR | Status: DC | PRN
  Administered 2016-09-26 (×3): 14 mL/h via EPIDURAL
  Filled 2016-09-26 (×2): qty 100

## 2016-09-26 MED ORDER — SOD CITRATE-CITRIC ACID 500-334 MG/5ML PO SOLN: 30.0000 mL | ORAL | Status: DC | PRN

## 2016-09-26 MED ORDER — OXYTOCIN 40 UNITS IN LACTATED RINGERS INFUSION - SIMPLE MED: 2.5000 [IU]/h | INTRAVENOUS | Status: DC

## 2016-09-26 MED ORDER — PRENATAL MULTIVITAMIN CH
1.0000 | ORAL_TABLET | Freq: Every day | ORAL | Status: DC
  Administered 2016-09-27 – 2016-09-28 (×2): 1 via ORAL
  Filled 2016-09-26 (×2): qty 1

## 2016-09-26 MED ORDER — ACETAMINOPHEN 325 MG PO TABS
650.0000 mg | ORAL_TABLET | ORAL | Status: DC | PRN
  Filled 2016-09-26: qty 2

## 2016-09-26 MED ORDER — LACTATED RINGERS IV SOLN: 500.0000 mL | Freq: Once | INTRAVENOUS | Status: DC

## 2016-09-26 MED ORDER — ZOLPIDEM TARTRATE 5 MG PO TABS: 5.0000 mg | ORAL_TABLET | Freq: Every evening | ORAL | Status: DC | PRN

## 2016-09-26 MED ORDER — OXYTOCIN BOLUS FROM INFUSION
500.0000 mL | Freq: Once | INTRAVENOUS | Status: AC
  Administered 2016-09-26: 500 mL via INTRAVENOUS

## 2016-09-26 MED ORDER — OXYCODONE-ACETAMINOPHEN 5-325 MG PO TABS: 1.0000 | ORAL_TABLET | ORAL | Status: DC | PRN

## 2016-09-26 MED ORDER — ONDANSETRON HCL 4 MG/2ML IJ SOLN: 4.0000 mg | INTRAMUSCULAR | Status: DC | PRN

## 2016-09-26 MED ORDER — TETANUS-DIPHTH-ACELL PERTUSSIS 5-2.5-18.5 LF-MCG/0.5 IM SUSP: 0.5000 mL | Freq: Once | INTRAMUSCULAR | Status: DC

## 2016-09-26 MED ORDER — OXYCODONE-ACETAMINOPHEN 5-325 MG PO TABS: 2.0000 | ORAL_TABLET | ORAL | Status: DC | PRN

## 2016-09-26 MED ORDER — COCONUT OIL OIL
1.0000 "application " | TOPICAL_OIL | Status: DC | PRN
  Filled 2016-09-26: qty 120

## 2016-09-26 MED ORDER — LACTATED RINGERS IV SOLN
INTRAVENOUS | Status: DC
  Administered 2016-09-26: 125 mL via INTRAVENOUS

## 2016-09-26 MED ORDER — ONDANSETRON HCL 4 MG PO TABS: 4.0000 mg | ORAL_TABLET | ORAL | Status: DC | PRN

## 2016-09-26 MED ORDER — OXYTOCIN 40 UNITS IN LACTATED RINGERS INFUSION - SIMPLE MED
1.0000 m[IU]/min | INTRAVENOUS | Status: DC
  Filled 2016-09-26: qty 1000

## 2016-09-26 MED ORDER — ACETAMINOPHEN 325 MG PO TABS: 650.0000 mg | ORAL_TABLET | ORAL | Status: DC | PRN

## 2016-09-26 MED ORDER — MISOPROSTOL 25 MCG QUARTER TABLET: 25.0000 ug | ORAL_TABLET | ORAL | Status: DC | PRN

## 2016-09-26 MED ORDER — IBUPROFEN 600 MG PO TABS
600.0000 mg | ORAL_TABLET | Freq: Four times a day (QID) | ORAL | Status: DC
  Administered 2016-09-26 – 2016-09-28 (×4): 600 mg via ORAL
  Filled 2016-09-26 (×4): qty 1

## 2016-09-26 MED ORDER — LACTATED RINGERS IV SOLN
500.0000 mL | Freq: Once | INTRAVENOUS | Status: AC
  Administered 2016-09-26: 500 mL via INTRAVENOUS

## 2016-09-26 MED ORDER — SENNOSIDES-DOCUSATE SODIUM 8.6-50 MG PO TABS
2.0000 | ORAL_TABLET | ORAL | Status: DC
  Administered 2016-09-26: 2 via ORAL
  Filled 2016-09-26 (×2): qty 2

## 2016-09-26 MED ORDER — DIBUCAINE 1 % RE OINT
1.0000 | TOPICAL_OINTMENT | RECTAL | Status: DC | PRN
Start: 2016-09-26 — End: 2016-09-28

## 2016-09-26 MED ORDER — PHENYLEPHRINE 40 MCG/ML (10ML) SYRINGE FOR IV PUSH (FOR BLOOD PRESSURE SUPPORT)
80.0000 ug | PREFILLED_SYRINGE | INTRAVENOUS | Status: DC | PRN
  Filled 2016-09-26: qty 10
  Filled 2016-09-26: qty 5

## 2016-09-26 NOTE — Progress Notes (Addendum)
Pt informed that the ultrasound is considered a limited OB ultrasound and is not intended to be a complete ultrasound exam.  Patient also informed that the ultrasound is not being completed with the intent of assessing for fetal or placental anomalies or any pelvic abnormalities.  Explained that the purpose of today's ultrasound is to assess for presentation and amniotic fluid volume.  Patient acknowledges the purpose of the exam and the limitations of the study.    Pt reports painful UC's since 0630 today which are 6 minutes apart. Cx exam per Dr. Alysia PennaErvin.

## 2016-09-26 NOTE — H&P (Signed)
Kara Hull is a 31 y.o. female G1P0 IUP 5338 6/7 weeks present to clinic today for NST. C/O regular painful ut ctx since 6:30 Am. Some bloody show but denies LOF. Reports good fetal movement.Prenatal course has been complainted by Class A2 Dm. Well controlled with Glyburide. GBS negative.  OB History    Gravida Para Term Preterm AB Living   1   0 0 0 0   SAB TAB Ectopic Multiple Live Births                 Past Medical History:  Diagnosis Date  . Gestational diabetes    glyburide  . Gestational diabetes mellitus (GDM)   . Right ovarian cyst   . Uterine polyp    Past Surgical History:  Procedure Laterality Date  . CYSTECTOMY Right 2016  . UTERINE FIBROID SURGERY  2016   uterine "polyps"   Family History: family history includes Cancer in her maternal grandmother; Diabetes in her mother; Diabetes Mellitus II in her maternal grandfather. Social History:  reports that she has never smoked. She has never used smokeless tobacco. She reports that she does not drink alcohol or use drugs.     Maternal Diabetes: Yes:  Diabetes Type:  Insulin/Medication controlled Genetic Screening: Declined Maternal Ultrasounds/Referrals: Normal Fetal Ultrasounds or other Referrals:  None Maternal Substance Abuse:  No Significant Maternal Medications:  Meds include: Other:  Significant Maternal Lab Results:  None Other Comments:  None  Review of Systems  Constitutional: Negative.   HENT: Negative.   Eyes: Negative.   Respiratory: Negative.   Cardiovascular: Negative.   Gastrointestinal: Negative.   Genitourinary: Negative.    History   Last menstrual period 12/29/2015. Exam Physical Exam  Constitutional: She appears well-developed and well-nourished.  Eyes: EOM are normal.  Neck: Normal range of motion. Neck supple.  Cardiovascular: Normal rate and regular rhythm.   Respiratory: Effort normal and breath sounds normal.  GI: Soft. Bowel sounds are normal.  gravid  Genitourinary:   Genitourinary Comments: 5/80%/-2 Vertex  Musculoskeletal:  Trace edema    Prenatal labs: ABO, Rh: O/POS/-- (07/21 1624) Antibody: NEG (07/21 1624) Rubella: 8.79 (07/21 1624) RPR: NON REAC (01/05 1655)  HBsAg: NEGATIVE (07/21 1624)  HIV: NONREACTIVE (01/05 1655)  GBS:     Assessment/Plan: IUP 38 6/7 weeks GDM controlled Active labor  Admit to L & D for management of active labor Charge nurse and OB attending notified   Hermina StaggersMichael L Korde Jeppsen 09/26/2016, 9:54 AM

## 2016-09-26 NOTE — Progress Notes (Signed)
LABOR PROGRESS NOTE  Kara BradyYanyan Hull is a 31 y.o. G1P0000 at 2017w6d  Subjective: S/p epidural placement  Objective: BP (!) 147/94   Pulse 63   Temp 98 F (36.7 C) (Oral)   Resp 20   Ht 5' 4.17" (1.63 m)   Wt 140 lb (63.5 kg)   LMP 12/29/2015 (Exact Date) Comment: lighter than usual  SpO2 99%   BMI 23.90 kg/m   Dilation: 5.5 Effacement (%): 80 Station: -2 Presentation: Vertex Exam by:: h koran rnc  Assessment / Plan: 31 y.o. G1P0000 at 4217w6d here for spontaneous labor  Labor: spontaneous, progressing Fetal Wellbeing:  Category I strip - baseline 135/moderate variability/+accels, no decels GDM:  CBG q4h, well controlled on glyburide Elevated BP:  Check PIH labs Pain Control:  Epidural placed Anticipated MOD:  vaginal  Charlsie MerlesJulia Rhoden, MD 09/26/2016, 11:58 AM

## 2016-09-26 NOTE — Anesthesia Preprocedure Evaluation (Signed)
Anesthesia Evaluation  Patient identified by MRN, date of birth, ID band Patient awake    Reviewed: Allergy & Precautions, Patient's Chart, lab work & pertinent test results  Airway Mallampati: II  TM Distance: >3 FB     Dental   Pulmonary neg pulmonary ROS,    Pulmonary exam normal        Cardiovascular hypertension (Elevated BP's), Normal cardiovascular exam     Neuro/Psych negative neurological ROS     GI/Hepatic negative GI ROS, Neg liver ROS,   Endo/Other  diabetes, Gestational  Renal/GU      Musculoskeletal   Abdominal   Peds  Hematology  (+) Blood dyscrasia (Thrombocytopenia), ,   Anesthesia Other Findings   Reproductive/Obstetrics (+) Pregnancy                             Lab Results  Component Value Date   WBC 5.7 09/26/2016   HGB 13.5 09/26/2016   HCT 38.7 09/26/2016   MCV 96.3 09/26/2016   PLT 101 (L) 09/26/2016   Lab Results  Component Value Date   CREATININE 1.03 (H) 09/26/2016   BUN 24 (H) 09/26/2016   NA 137 09/26/2016   K 4.1 09/26/2016   CL 110 09/26/2016   CO2 18 (L) 09/26/2016    Anesthesia Physical Anesthesia Plan  ASA: III  Anesthesia Plan: Epidural   Post-op Pain Management:    Induction:   Airway Management Planned: Natural Airway  Additional Equipment:   Intra-op Plan:   Post-operative Plan:   Informed Consent: I have reviewed the patients History and Physical, chart, labs and discussed the procedure including the risks, benefits and alternatives for the proposed anesthesia with the patient or authorized representative who has indicated his/her understanding and acceptance.     Plan Discussed with:   Anesthesia Plan Comments:         Anesthesia Quick Evaluation

## 2016-09-26 NOTE — Progress Notes (Signed)
S. Patient resting in bed, no pain at this time.  O: Pre-e labs normal, platelets 101 FHR Is 135 with moderate variability, present accels, no decels, contractions q 2-3  A: G1P0 in active labor; Cat 1 Fetal tracing P: repeat CBC for platelets at 4 pm -begin 2 hour blood sugar monitoring Anticipate NSVD Luna KitchensKathryn Tyreque Finken CNM

## 2016-09-26 NOTE — Progress Notes (Signed)
Pt here for NST C/O Ut ctx q 5-6 mins SVE 5/80/-2 Will admit for management of active labor Charge nurse and OB attending notified

## 2016-09-26 NOTE — Lactation Note (Signed)
This note was copied from a baby's chart. Lactation Consultation Note Discussion with RN related to baby being 6 HOL and not feeding yet. Mom is a diabetic and was on glyburide. Blood sugars at this time are normal. Suggested RN keep baby skin to skin on mom and observe for signs of hypoglycemia.    Patient Name: Kara Duard BradyYanyan Geister YNWGN'FToday's Date: 09/26/2016     Maternal Data    Feeding Feeding Type: Breast Fed Length of feed: 1 min  LATCH Score/Interventions Latch: Too sleepy or reluctant, no latch achieved, no sucking elicited. Intervention(s): Skin to skin  Audible Swallowing: None Intervention(s): Skin to skin  Type of Nipple: Everted at rest and after stimulation  Comfort (Breast/Nipple): Soft / non-tender     Hold (Positioning): Assistance needed to correctly position infant at breast and maintain latch.  LATCH Score: 5  Lactation Tools Discussed/Used     Consult Status      Soyla DryerJoseph, Emie Sommerfeld 09/26/2016, 11:27 PM

## 2016-09-26 NOTE — Anesthesia Pain Management Evaluation Note (Signed)
  CRNA Pain Management Visit Note  Patient: Kara Hull, 31 y.o., female  "Hello I am a [facility] the [facility]. We have an anesthesia team available at all times to provide care throughout the hospital, including epidural management and anesthesia for C-section. I don't know your plan for the delivery whether it a natural birth, water birth, IV sedation, nitrous supplementation, doula or epidural, but we want to meet your pain goals."   1.Was your pain managed to your expectations on prior hospitalizations?   Yes   2.What is your expectation for pain management during this hospitalization?     Epidural  3.How can we help you reach that goal?   Record the patient's initial score and the patient's pain goal.   Pain: 6  Pain Goal: 0 [facility] wants you to be able to say your pain was always managed very well.  Laban EmperorMalinova,Emmanuela Ghazi Hristova 09/26/2016

## 2016-09-26 NOTE — Progress Notes (Addendum)
   Kara Hull is a 31 y.o. G1P0000 at 574w6d  admitted for SOL after being 5 cm att WOC appointment.  Subjective: Patient feeling pelvic pressure. Epidural in place.   Objective: Vitals:   09/26/16 1500 09/26/16 1533 09/26/16 1602 09/26/16 1603  BP: 105/67 121/78 139/89   Pulse: 68 65 69   Resp: 20 18 18    Temp:    98.5 F (36.9 C)  TempSrc:    Oral  SpO2:      Weight:      Height:       No intake/output data recorded.  FHT:  FHR: 140 bpm, variability: moderate,  accelerations:  Present,  decelerations:  Absent UC:   regular, every 2-3 minutes SVE:   Dilation: 10 Effacement (%): 100 Station: 0 Exam by:: Luna KitchensKathryn Shayda Kalka, CNM  Labs: Lab Results  Component Value Date   WBC 5.7 09/26/2016   HGB 13.5 09/26/2016   HCT 38.7 09/26/2016   MCV 96.3 09/26/2016   PLT 101 (L) 09/26/2016    Assessment / Plan: Spontaneous labor, progressing normally  Last blood sugar is 68; will provide some oral hydration Labor: Progressing normally Fetal Wellbeing:  Category I Pain Control:  Epidural Anticipated MOD:  NSVD -Patient desires to try pushing; patient pushing with Dr. Lenice Pressmanhoden  Jannah Guardiola Lorraine Eldora Napp CNM 09/26/2016, 4:39 PM

## 2016-09-26 NOTE — Anesthesia Procedure Notes (Signed)
Epidural Patient location during procedure: OB Start time: 09/26/2016 11:33 AM End time: 09/26/2016 11:39 AM  Staffing Anesthesiologist: Marcene DuosFITZGERALD, Alyxander Kollmann Performed: anesthesiologist   Preanesthetic Checklist Completed: patient identified, site marked, surgical consent, pre-op evaluation, timeout performed, IV checked, risks and benefits discussed and monitors and equipment checked  Epidural Patient position: sitting Prep: site prepped and draped and DuraPrep Patient monitoring: continuous pulse ox and blood pressure Approach: midline Location: L4-L5 Injection technique: LOR air  Needle:  Needle type: Tuohy  Needle gauge: 17 G Needle length: 9 cm and 9 Needle insertion depth: 4 cm Catheter type: closed end flexible Catheter size: 19 Gauge Catheter at skin depth: 10 (9-->10cm when laid in lateral decub.) cm Test dose: negative  Assessment Events: blood not aspirated, injection not painful, no injection resistance, negative IV test and no paresthesia

## 2016-09-27 LAB — CBC
HEMATOCRIT: 30.9 % — AB (ref 36.0–46.0)
HEMOGLOBIN: 11.1 g/dL — AB (ref 12.0–15.0)
MCH: 34.5 pg — ABNORMAL HIGH (ref 26.0–34.0)
MCHC: 35.9 g/dL (ref 30.0–36.0)
MCV: 96 fL (ref 78.0–100.0)
Platelets: 82 10*3/uL — ABNORMAL LOW (ref 150–400)
RBC: 3.22 MIL/uL — AB (ref 3.87–5.11)
RDW: 12.3 % (ref 11.5–15.5)
WBC: 8.9 10*3/uL (ref 4.0–10.5)

## 2016-09-27 LAB — GLUCOSE, CAPILLARY: Glucose-Capillary: 85 mg/dL (ref 65–99)

## 2016-09-27 LAB — GLUCOSE, RANDOM: GLUCOSE: 79 mg/dL (ref 65–99)

## 2016-09-27 NOTE — Anesthesia Postprocedure Evaluation (Signed)
Anesthesia Post Note  Patient: Duard BradyYanyan Goris  Procedure(s) Performed: * No procedures listed *  Patient location during evaluation: Mother Baby Anesthesia Type: Epidural Level of consciousness: awake and alert Pain management: pain level controlled Vital Signs Assessment: post-procedure vital signs reviewed and stable Respiratory status: spontaneous breathing Cardiovascular status: blood pressure returned to baseline Postop Assessment: no headache, patient able to bend at knees, no backache, no signs of nausea or vomiting, epidural receding and adequate PO intake Anesthetic complications: no        Last Vitals:  Vitals:   09/26/16 2100 09/27/16 0100  BP: 124/87 121/77  Pulse: 78 72  Resp: 18 18  Temp: 37.5 C 37.1 C    Last Pain:  Vitals:   09/27/16 0552  TempSrc:   PainSc: 0-No pain   Pain Goal: Patients Stated Pain Goal: 0 (09/26/16 1026)               Jennye MoccasinSterling, Weldon Pickingharlesetta Marie

## 2016-09-27 NOTE — Progress Notes (Signed)
Per Dr. Charlsie MerlesJulia Rhoden, RN given order to hold motrin for now and given tylenol instead for patient pain d/t platelet count.

## 2016-09-27 NOTE — Progress Notes (Signed)
MOB asked for supplementation with formula. Talked to Selena BattenKim with [facility] who had already explained the different methods for which baby could be supplemented. I assisted MOB and FOB with a cup and explained with teach back method on how to properly give to baby. Alumentum was given/

## 2016-09-27 NOTE — Lactation Note (Signed)
This note was copied from a baby's chart. Lactation Consultation Note  Patient Name: Kara Hull DYJWL'K Date: 09/27/2016   Mobile interpreter, "Kara Hull," [phone]) used for consult. We discussed nipple shield, latch, how to use DEBP, how to disassemble, clean, & reassemble pump parts.Mom shown how to assemble & use hand pump that was included in pump kit.   Mom was observed pumping for a few minutes before infant began showing feeding cues. She could not tolerate anything above the most minimum suction. Size 21 flanges are an appropriate fit for her at this time.   When infant began showing feeding cues, Mom was willing to put infant to breast. She showed intense discomfort, initially, but told the interpreter she wanted to continue feeding the infant. Mom says the nipple shield does not make much of a difference in regards to pain.   Prior to latching, an oral exam was done. Infant has a good, functional suck & lateralizes well. Rest of exam limited due to infant being upset.   Per interpreter, parents are ok w/calling me when they'd like me to return. Infant still feeding. RN to bring in coconut oil. Matthias Hughs [facility] 09/27/2016, 8:10 PM

## 2016-09-27 NOTE — Lactation Note (Signed)
This note was copied from a baby's chart. Lactation Consultation Note  Patient Name: Kara Hull ZOXWR'UToday's Date: 09/27/2016   Mom assisted w/latching on R side. Infant latched w/ease & swallows were readily noted (swallows verified by cervical auscultation). However, Mom with intense discomfort that did not improve with changing her position, verifying good latch, etc. Infant removed from R breast & nipple noted to be pinched. Compression stripe also noted on L nipple. Nipple shield (size 20) taken into room. Parents have my # to call for assist w/next feeding.  Shells w/makeshift bra (made from mesh panties) to protect nipples also brought into the room.   [name], [name] 09/27/2016, 5:56 PM

## 2016-09-27 NOTE — Plan of Care (Signed)
Problem: Pain Management: Goal: General experience of comfort will improve and pain level will decrease Outcome: Progressing Using interpreter [phone]#[id number], educated pt on calling RN if pain medication needed.  Informed pt that she would no longer be receiving the scheduled Motrin d/t her platelet count but that if she needed something for pain, Tylenol was available to her at her request.  Pt verbalized understanding and declined pain medication at the present time.

## 2016-09-27 NOTE — Progress Notes (Signed)
Charlsie MerlesJulia Rhoden notified of platelets of 96. She said it was okay to give motrin.  Royston CowperIsley, Rahcel Shutes E, RN

## 2016-09-27 NOTE — Anesthesia Postprocedure Evaluation (Signed)
Anesthesia Post Note  Patient: Kara BradyYanyan Hull  Procedure(s) Performed: * No procedures listed *  Patient location during evaluation: [facility] Anesthesia Type: Epidural Level of consciousness: awake and alert Pain management: pain level controlled Vital Signs Assessment: post-procedure vital signs reviewed and stable Respiratory status: spontaneous breathing Cardiovascular status: blood pressure returned to baseline Postop Assessment: no headache, patient able to bend at knees, no backache, no signs of nausea or vomiting, epidural receding and adequate PO intake Anesthetic complications: no        Last Vitals:  Vitals:   09/26/16 2100 09/27/16 0100  BP: 124/87 121/77  Pulse: 78 72  Resp: 18 18  Temp: 37.5 C 37.1 C    Last Pain:  Vitals:   09/27/16 0552  TempSrc:   PainSc: 0-No pain   Pain Goal: Patients Stated Pain Goal: 0 (09/26/16 1026)               Jennye MoccasinSterling, Weldon Pickingharlesetta Marie

## 2016-09-27 NOTE — Progress Notes (Signed)
Post Partum Day 1 Subjective: no complaints, up ad lib, voiding, tolerating PO and + flatus  Objective: Blood pressure 121/77, pulse 72, temperature 98.8 F (37.1 C), temperature source Oral, resp. rate 18, height 5' 4.17" (1.63 m), weight 140 lb (63.5 kg), last menstrual period 12/29/2015, SpO2 97 %, unknown if currently breastfeeding.  Physical Exam:  General: alert, cooperative and no distress Lochia: appropriate Uterine Fundus: firm Incision: healing well DVT Evaluation: No evidence of DVT seen on physical exam.   Recent Labs  09/26/16 1556 09/27/16 0602  HGB 12.9 11.1*  HCT 37.2 30.9*    Assessment/Plan: Plan for discharge tomorrow and Breastfeeding   LOS: 1 day   Wendee Beaversavid J Breeanne Oblinger, DO, PGY-1 09/27/2016, 7:42 AM

## 2016-09-28 ENCOUNTER — Inpatient Hospital Stay (HOSPITAL_COMMUNITY): Admission: RE | Admit: 2016-09-28 | Payer: Self-pay | Source: Ambulatory Visit

## 2016-09-28 LAB — CBC
HCT: 32.9 % — ABNORMAL LOW (ref 36.0–46.0)
Hemoglobin: 11.7 g/dL — ABNORMAL LOW (ref 12.0–15.0)
MCH: 34.1 pg — ABNORMAL HIGH (ref 26.0–34.0)
MCHC: 35.6 g/dL (ref 30.0–36.0)
MCV: 95.9 fL (ref 78.0–100.0)
PLATELETS: 107 10*3/uL — AB (ref 150–400)
RBC: 3.43 MIL/uL — AB (ref 3.87–5.11)
RDW: 12.4 % (ref 11.5–15.5)
WBC: 10.6 10*3/uL — ABNORMAL HIGH (ref 4.0–10.5)

## 2016-09-28 NOTE — Lactation Note (Signed)
This note was copied from a baby's chart. Lactation Consultation Note  Patient Name: Kara Hull BJYNW'GToday's Date: 09/28/2016 Reason for consult: Follow-up assessment;Breast/nipple pain Video interpreter [phone]#[id number] Kara Hull used for visit. Mom has positional stripes both nipples and have severe pain with nursing. Mom agreed to attempt to latch with 20 nipple shield. LC assisted so baby would obtain good depth with initial latch but it was too painful for Mom to keep baby at breast. Mom did agree to pump using 21 flanges and was able to tolerate pumping. Mom does have colostrum present with hand expression and scant amount visible in nipple shield with baby taking few suckles off/on. Plan discussed for Mom at d/c to let nipples heal and then work on latch at OP visit on Friday: Mom to pump breasts every 3 hours for 15-20 minutes to encourage milk production. Use 21 flanges. Mom has Medela DEBP for home use.  Advised to feed baby every 3 hours 30+ ml with each feeding, supplemental guidelines per hours of age reviewed with parents. Use EBM when available, formula as needed.  Demonstrated how to use suck training and paced feeding with bottle, advised to get Dr. Manson PasseyBrown #1 bottle to be more like breast and control flow of milk. Mom to use EBM to sore nipples, comfort gels after pumping.  OP f/u with lactation scheduled for Friday, 09/30/16 at 4:00 to work on latch. Mom to call for questions/concerns. Mom able to teach back d/c instructions.    Maternal Data    Feeding Feeding Type: Formula Nipple Type: Slow - flow Length of feed: 0 min  LATCH Score/Interventions                      Lactation Tools Discussed/Used Tools: Nipple Shields;Pump;Comfort gels;Flanges Nipple shield size: 20 Flange Size: Other (comment) (21 flange) Breast pump type: Double-Electric Breast Pump   Consult Status Consult Status: Complete Date: 09/28/16 Follow-up type: In-patient    Kara Hull, Kara Mark  Hull 09/28/2016, 12:38 PM

## 2016-09-28 NOTE — Progress Notes (Signed)
Discharge education and paperwork discussed.  Reasons to call the MD reviewed.  Interpreter [phone] used.  Pt and significant other verbalize understanding.

## 2016-09-28 NOTE — Discharge Instructions (Signed)

## 2016-09-28 NOTE — Plan of Care (Signed)
Problem: Nutritional: Goal: Mothers verbalization of comfort with breastfeeding process will improve Outcome: Completed/Met Date Met: 09/28/16 Using interpreter [phone], discussed supplementation using artifical nipple instead of cup per MD request.  Discussed formula feeding amounts and paced feeding and burping.  Pt reporting severe nipple pain with breastfeeding.  Linear bruises noted on both nipples.  Pt using DEBP when she does not put the baby to the breast.  Pt encouraged to call RN for next latch.

## 2016-09-28 NOTE — Discharge Summary (Signed)
OB Discharge Summary     Patient Name: Kara BradyYanyan Ainsley DOB: [birth year] MRN: [id number]  Date of admission: 09/26/2016 Delivering MD: Marylene LandKOOISTRA, KATHRYN LORRAINE   Date of discharge: 09/28/2016  Admitting diagnosis: LABOR Intrauterine pregnancy: 6399w6d     Secondary diagnosis:  Active Problems:   Gestational diabetes   GDM, class A2   Gestational thrombocytopenia [facility](HCC)  Additional problems: Patient Active Problem List   Diagnosis Date Noted  . Gestational diabetes 09/26/2016  . GDM, class A2 09/26/2016  . Gestational thrombocytopenia (HCC) 09/26/2016  . Language barrier 09/23/2016  . Pruritus 09/23/2016  . Supervision of high risk pregnancy in third trimester 08/15/2016  . Gestational diabetes mellitus (GDM) 07/22/2016  . Encounter for supervision of other normal pregnancy 02/05/2016  . Ovarian cyst 08/05/2015     Discharge diagnosis: Term Pregnancy Delivered, CHTN and Type 2 DM                                                                                                Post partum procedures:None  Augmentation: None  Complications: None  Hospital course:  Onset of Labor With Vaginal Delivery     31 y.o. yo G1P1001 at 5899w6d was admitted in Latent Labor on 09/26/2016. Patient had an uncomplicated labor course as follows:  Membrane Rupture Time/Date: 12:44 PM ,09/26/2016   Intrapartum Procedures: Episiotomy: None [1]                                         Lacerations:  2nd degree [3];Perineal [11]  Patient had a delivery of a Viable infant. 09/26/2016  Information for the patient's newborn:  Loni Beckwithian, Boy Florabel [[id number]][[id number]]  Delivery Method: Vaginal, Spontaneous Delivery (Filed from Delivery Summary)    Pateint had an uncomplicated postpartum course.  She is ambulating, tolerating a regular diet, passing flatus, and urinating well. Patient is discharged home in stable condition on 09/28/16.   Physical exam  Vitals:   09/26/16 2100 09/27/16 0100 09/27/16 1808 09/28/16 0641   BP: 124/87 121/77 123/81 107/77  Pulse: 78 72 68 84  Resp: 18 18 18 18   Temp: 99.5 F (37.5 C) 98.8 F (37.1 C) 98.2 F (36.8 C) 97.7 F (36.5 C)  TempSrc: Oral Oral Oral Oral  SpO2: 98% 97%    Weight:      Height:       General: alert, cooperative and no distress Lochia: appropriate Uterine Fundus: firm Incision: Healing well with no significant drainage DVT Evaluation: No evidence of DVT seen on physical exam. Labs: Lab Results  Component Value Date   WBC 10.6 (H) 09/28/2016   HGB 11.7 (L) 09/28/2016   HCT 32.9 (L) 09/28/2016   MCV 95.9 09/28/2016   PLT 107 (L) 09/28/2016   CMP Latest Ref Rng & Units 09/27/2016  Glucose 65 - 99 mg/dL 79  BUN 6 - 20 mg/dL -  Creatinine 7.820.44 - 9.561.00 mg/dL -  Sodium 213135 - 086145 mmol/L -  Potassium 3.5 - 5.1 mmol/L -  Chloride 101 -  111 mmol/L -  CO2 22 - 32 mmol/L -  Calcium 8.9 - 10.3 mg/dL -  Total Protein 6.5 - 8.1 g/dL -  Total Bilirubin 0.3 - 1.2 mg/dL -  Alkaline Phos 38 - 213 U/L -  AST 15 - 41 U/L -  ALT 14 - 54 U/L -    Discharge instruction: per After Visit Summary and "Baby and Me Booklet".  After visit meds:  Allergies as of 09/28/2016   No Known Allergies     Medication List    STOP taking these medications   glyBURIDE 1.25 MG tablet Commonly known as:  DIABETA     TAKE these medications   Prenatal Vitamin 27-0.8 MG Tabs Take 1 tablet by mouth daily.   REFRESH OP Place 2 drops into both eyes as needed (for dry eyes).       Diet: routine diet  Activity: Advance as tolerated. Pelvic rest for 6 weeks.   Outpatient follow up:6 weeks Follow up Appt:Future Appointments Date Time Provider Department Center  10/31/2016 1:20 PM Marny Lowenstein, PA-C [facility]   Follow up Visit:No Follow-up on file.  Postpartum contraception: Undecided  Newborn Data: Live born female  Birth Weight: 6 lb 6.7 oz (2910 g) APGAR: 8, 9  Baby Feeding: Breast Disposition:home with mother   09/28/2016 Wendee Beavers,  DO, PGY-1  I spoke with and examined patient and agree with resident/PA/SNM's note and plan of care.  Breastfeeding, plans outpatient circ, condoms for contraception Will need 2hr gtt 6-8wks pp Cheral Marker, CNM, [facility] 09/28/2016 9:03 AM

## 2016-09-29 LAB — RPR: RPR Ser Ql: NONREACTIVE

## 2016-10-10 ENCOUNTER — Ambulatory Visit (INDEPENDENT_AMBULATORY_CARE_PROVIDER_SITE_OTHER): Payer: Self-pay | Admitting: Obstetrics & Gynecology

## 2016-10-10 ENCOUNTER — Encounter: Payer: Self-pay | Admitting: Obstetrics & Gynecology

## 2016-10-10 DIAGNOSIS — N898 Other specified noninflammatory disorders of vagina: Secondary | ICD-10-CM

## 2016-10-10 DIAGNOSIS — R300 Dysuria: Secondary | ICD-10-CM

## 2016-10-10 LAB — POCT URINALYSIS DIP (DEVICE)
BILIRUBIN URINE: NEGATIVE
GLUCOSE, UA: NEGATIVE mg/dL
KETONES UR: NEGATIVE mg/dL
LEUKOCYTES UA: NEGATIVE
Nitrite: NEGATIVE
Protein, ur: NEGATIVE mg/dL
Specific Gravity, Urine: <=1.005 (ref 1.005–1.030)
Urobilinogen, UA: 0.2 mg/dL (ref 0.0–1.0)
pH: 5.5 (ref 5.0–8.0)

## 2016-10-10 MED ORDER — CLINDAMYCIN HCL 300 MG PO CAPS: 300.0000 mg | ORAL_CAPSULE | Freq: Two times a day (BID) | ORAL | 0 refills | Status: AC

## 2016-10-10 NOTE — Progress Notes (Signed)
   GYNECOLOGY OFFICE VISIT NOTE  History:  31 y.o. G1P1001 here today for evaluation of 2nd degree laceration sustained during delivery on 09/26/16. Mandarin speaking only, interpreter used.  Accompanied by husband.  Reports having some pain around laceration, some abnormal discharge and some burning during urination.  She denies any fevers, nausea, vomiting, chills, abnormal vaginal, bleeding, pelvic pain or other concerns.   Past Medical History:  Diagnosis Date  . Gestational diabetes    glyburide  . Gestational diabetes mellitus (GDM)   . Right ovarian cyst   . Uterine polyp     Past Surgical History:  Procedure Laterality Date  . CYSTECTOMY Right 2016  . UTERINE FIBROID SURGERY  2016   uterine "polyps"    The following portions of the patient's history were reviewed and updated as appropriate: allergies, current medications, past family history, past medical history, past social history, past surgical history and problem list.    Review of Systems:  Pertinent items noted in HPI and remainder of comprehensive ROS otherwise negative.   Objective:  Physical Exam BP 105/73   Pulse 90   Ht 5' 4.17" (1.63 m)   Wt 124 lb (56.2 kg)   Breastfeeding? Yes   BMI 21.17 kg/m  CONSTITUTIONAL: Well-developed, well-nourished female in no acute distress.  CARDIOVASCULAR: Normal heart rate noted RESPIRATORY: Effort and breath sounds normal, no problems with respiration noted ABDOMEN: Soft, no distention noted.   PELVIC: Normal appearing external genitalia. Small, tender (8 mm) area that is not healing well on right superior side of perineal laceration, silver nitrate applied to area. Rest of laceration is okay.   Yellow discharge seen, sample obtained.. MUSCULOSKELETAL: Normal range of motion. No edema noted.  Labs and Imaging Results for orders placed or performed in visit on 10/10/16 (from the past 24 hour(s))  POCT urinalysis dip (device)     Status: Abnormal   Collection Time:  10/10/16  8:54 AM  Result Value Ref Range   Glucose, UA NEGATIVE NEGATIVE mg/dL   Bilirubin Urine NEGATIVE NEGATIVE   Ketones, ur NEGATIVE NEGATIVE mg/dL   Specific Gravity, Urine <=1.005 1.005 - 1.030   Hgb urine dipstick TRACE (A) NEGATIVE   pH 5.5 5.0 - 8.0   Protein, ur NEGATIVE NEGATIVE mg/dL   Urobilinogen, UA 0.2 0.0 - 1.0 mg/dL   Nitrite NEGATIVE NEGATIVE   Leukocytes, UA NEGATIVE NEGATIVE     Assessment & Plan:  1. Second degree perineal laceration Silver nitrate used to treat a small area, rest of laceration is healing well.   2. Vaginal discharge Likely BV. Clindamycin prescribed  (will also possibly cover any cellulitis) - Cervicovaginal ancillary only  3. Dysuria Likely due to perineal laceration superficial unhealed area.  Will check culture to make sure.  - Culture, OB Urine  Return in about 4 weeks (around 11/07/2016) for 2 hr GTT (needs to be fasting), Postpartum check.  Jaynie CollinsUGONNA  Violeta Lecount, MD, FACOG Attending Obstetrician & Gynecologist, [facility], [facility]

## 2016-10-10 NOTE — Patient Instructions (Addendum)
Return to clinic for any scheduled appointments or for any concerns as needed.   

## 2016-10-11 LAB — CERVICOVAGINAL ANCILLARY ONLY
BACTERIAL VAGINITIS: NEGATIVE
CANDIDA VAGINITIS: NEGATIVE
TRICH (WINDOWPATH): NEGATIVE

## 2016-10-11 MED FILL — CLINDAMYCIN HCL 300 MG CAP: 300 | 7 days supply | Qty: 14 | Fill #0

## 2016-10-31 ENCOUNTER — Ambulatory Visit: Payer: Self-pay | Admitting: Medical

## 2016-10-31 MED FILL — CLINDAMYCIN HCL 300 MG CAP: 300 | 7 days supply | Qty: 14 | Fill #0

## 2016-11-07 ENCOUNTER — Ambulatory Visit (INDEPENDENT_AMBULATORY_CARE_PROVIDER_SITE_OTHER): Payer: Self-pay | Admitting: Family Medicine

## 2016-11-07 ENCOUNTER — Encounter: Payer: Self-pay | Admitting: Family Medicine

## 2016-11-07 VITALS — BP 101/76 | HR 64 | Wt 118.5 lb

## 2016-11-07 DIAGNOSIS — O24419 Gestational diabetes mellitus in pregnancy, unspecified control: Secondary | ICD-10-CM

## 2016-11-07 NOTE — Progress Notes (Signed)
Subjective:     Kara Hull is a 31 y.o. female who presents for a postpartum visit. She is 6 weeks postpartum following a spontaneous vaginal delivery. I have fully reviewed the prenatal and intrapartum course. The delivery was at 38.6 gestational weeks. Outcome: spontaneous vaginal delivery. Anesthesia: epidural. Postpartum course has been unremarkable. Baby's course has been unremarkable. Baby is feeding by breast. Bleeding staining only. Bowel function is normal. Bladder function is normal. Patient is not sexually active. Contraception method is none, desires to use condoms only. Discussed all options of birth control at length, and patient decides she does not want to use any of them. Postpartum depression screening: negative.  The following portions of the patient's history were reviewed and updated as appropriate: allergies, current medications, past family history, past medical history, past social history, past surgical history and problem list.  Review of Systems Pertinent items are noted in HPI.   Objective:    BP 101/76   Pulse 64   Wt 118 lb 8 oz (53.8 kg)   Breastfeeding? Yes   BMI 20.23 kg/m   General:  alert, cooperative, appears stated age and no distress   Breasts:  inspection negative, no nipple discharge or bleeding, no masses or nodularity palpable. Engorged, appropriate milk expression.  Lungs: clear to auscultation bilaterally  Heart:  regular rate and rhythm, S1, S2 normal, no murmur, click, rub or gallop  Abdomen: soft, non-tender; bowel sounds normal; no masses,  no organomegaly   Vulva:  normal  Vagina: normal vagina and scant old bloody discharge. 2nd degree laceration completely healed.  Cervix:  did not assess  Corpus: normal size, contour, position, consistency, mobility, non-tender  Adnexa:  normal adnexa  Rectal Exam: Not performed. No external hemorrhoids.         Assessment:     6 weeks postpartum exam. Pap smear done 07/15/15, NEG.   Plan:    1.  Contraception: condoms, reviewed all forms and both her and her husband prefer condoms. Explained emergency contraception as they would like to wait 2-3 years before next child. 2. Repeat pap next year.  3. 2 hr GTT today for gestational diabetes class A2 4. Follow up in: 1 year or as needed.    Cleda Clarks, DO  [facility] for [facility], [facility]

## 2016-11-07 NOTE — Patient Instructions (Signed)

## 2016-11-08 LAB — GLUCOSE TOLERANCE, 2 HOURS
GLUCOSE FASTING GTT: 76 mg/dL (ref 65–99)
GLUCOSE, 2 HOUR: 88 mg/dL (ref 65–139)

## 2016-11-10 ENCOUNTER — Telehealth: Payer: Self-pay | Admitting: General Practice

## 2016-11-10 ENCOUNTER — Encounter: Payer: Self-pay | Admitting: General Practice

## 2016-11-10 NOTE — Telephone Encounter (Signed)
Called patient with pacific interpreter [phone] and husband answered stating she wasn't at home. He provided alternate number of [phone], no answer and voicemail was not set up. Will send letter

## 2016-11-10 NOTE — Telephone Encounter (Signed)
Patient's husband called and left message requesting diabetes test results.

## 2016-11-21 MED FILL — POLYMYXIN B/TMP EYE DROPS: 10000-0.1 | 40 days supply | Qty: 10 | Fill #0

## 2017-11-13 IMAGING — US US TRANSVAGINAL NON-OB
1 series · 13 of 25 positions shown · non-contrast
Comparison: No prior.

CLINICAL DATA: Pelvic pain.

EXAM:
TRANSABDOMINAL AND TRANSVAGINAL ULTRASOUND OF PELVIS
TECHNIQUE: Both transabdominal and transvaginal ultrasound examinations of the
pelvis were performed. Transabdominal technique was performed for
global imaging of the pelvis including uterus, ovaries, adnexal
regions, and pelvic cul-de-sac. It was necessary to proceed with
endovaginal exam following the transabdominal exam to visualize the
uterus and ovaries.

[Series 1: us transvaginal non-ob · 0.20mm/px · 13 of 102 slices shown]
[im 1/102]
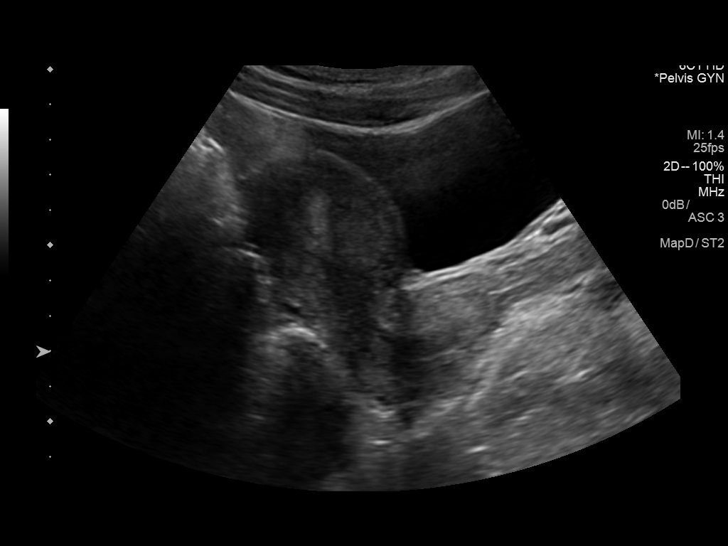
[im 9/102]
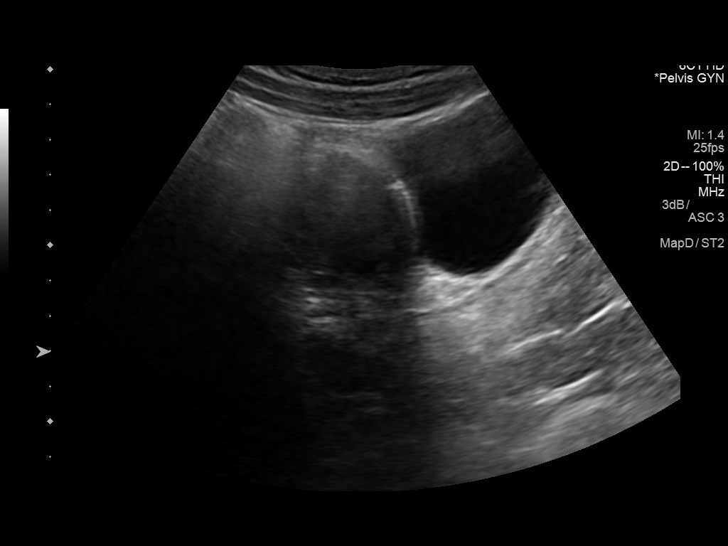
[im 17/102]
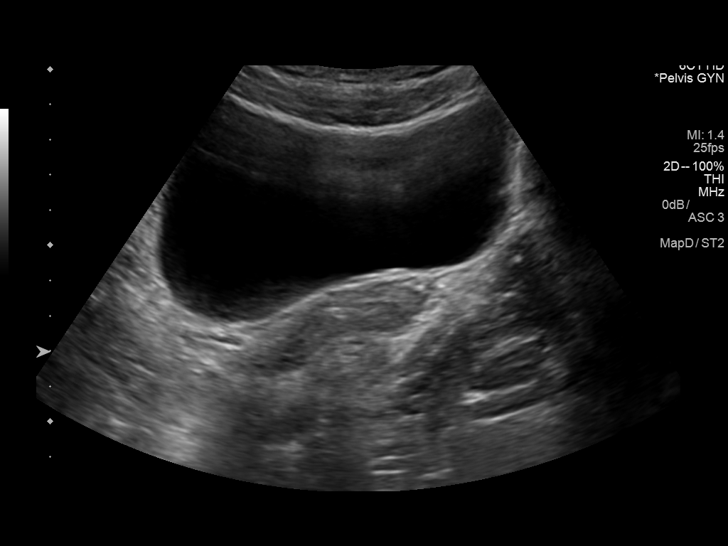
[im 26/102]
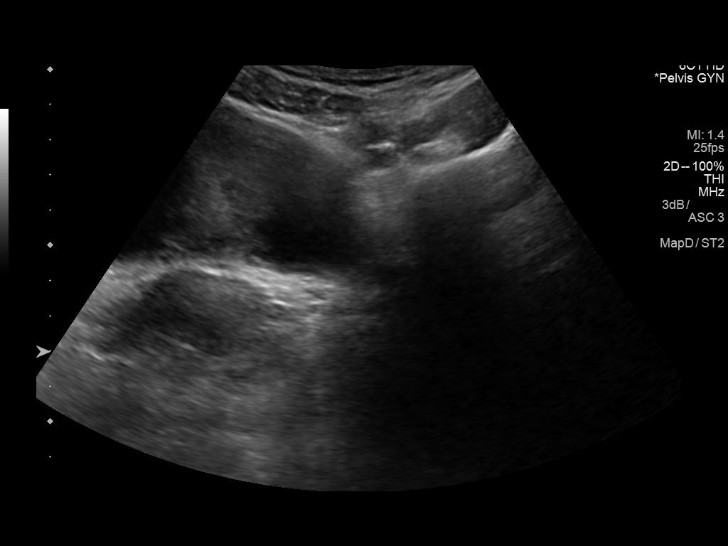
[im 34/102]
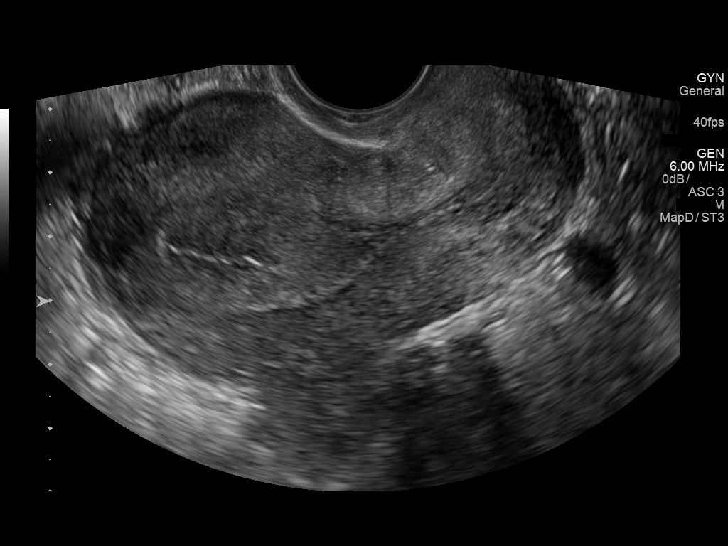
[im 43/102]
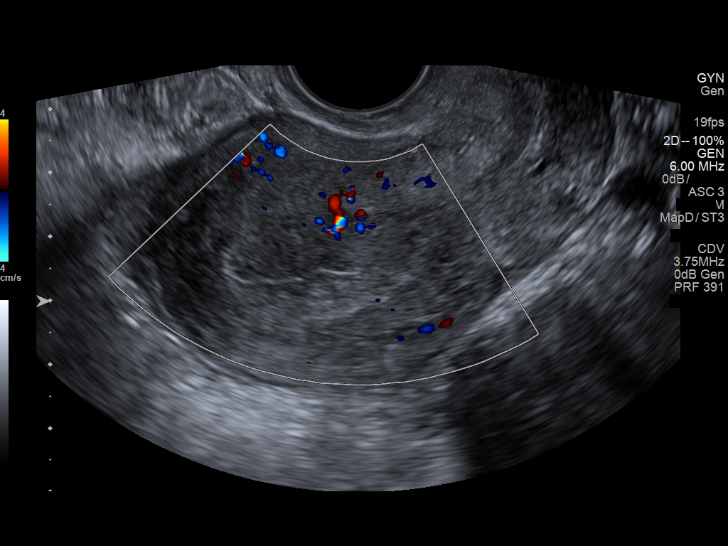
[im 51/102]
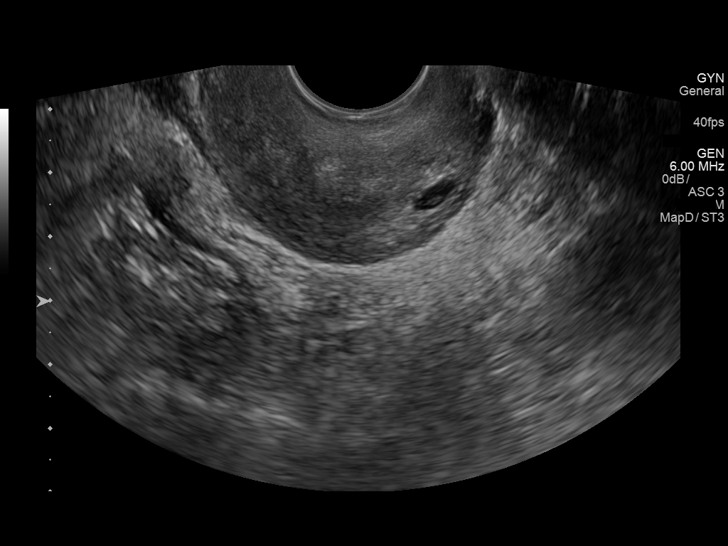
[im 59/102]
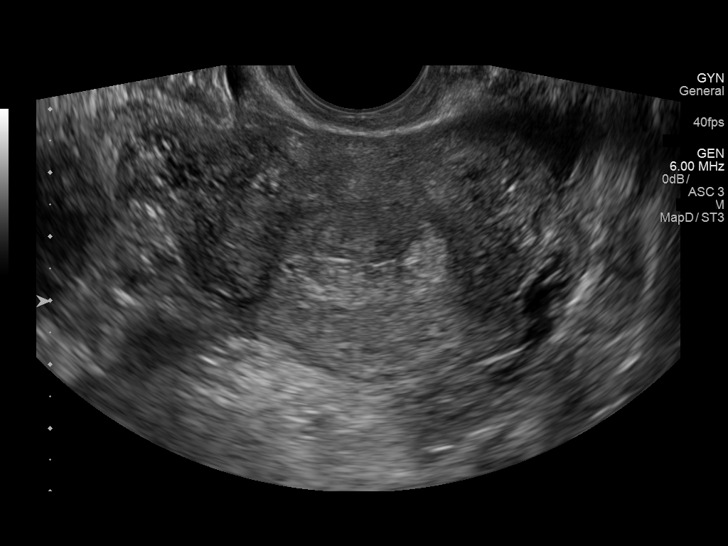
[im 68/102]
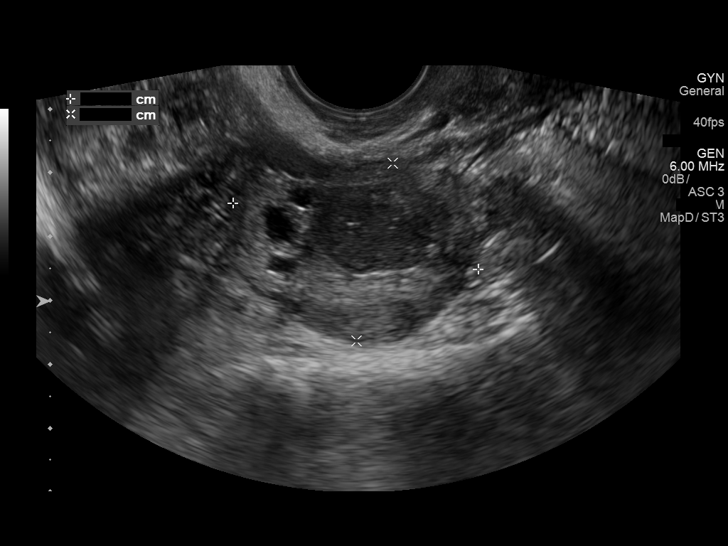
[im 76/102]
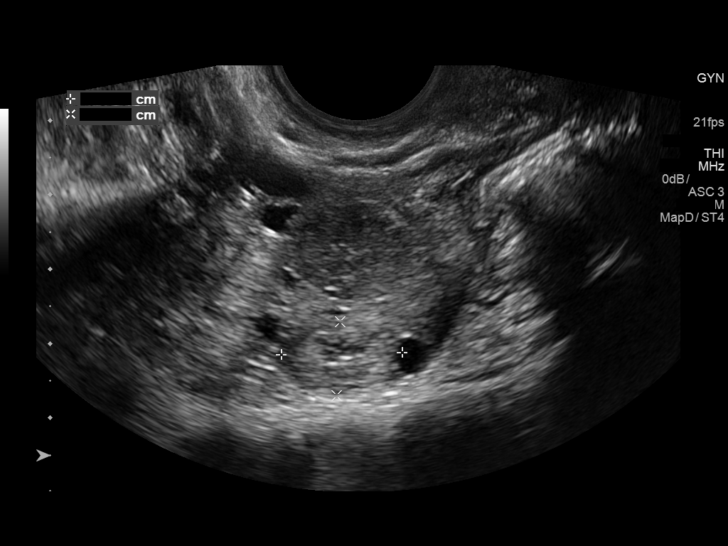
[im 85/102]
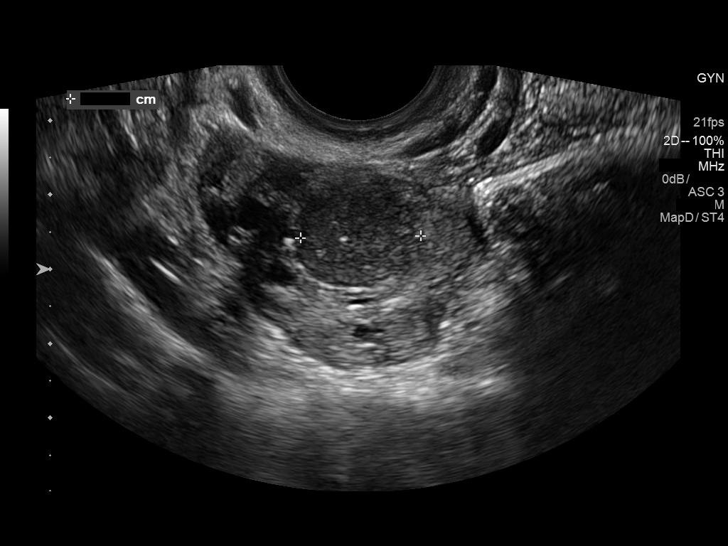
[im 93/102]
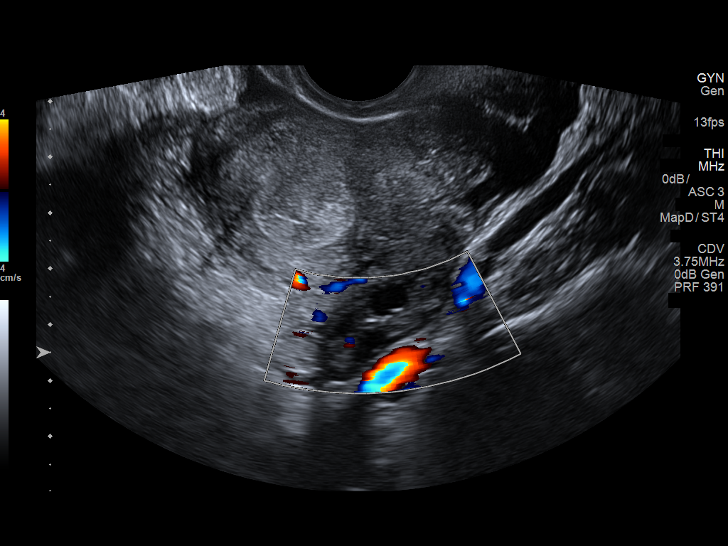
[im 102/102]
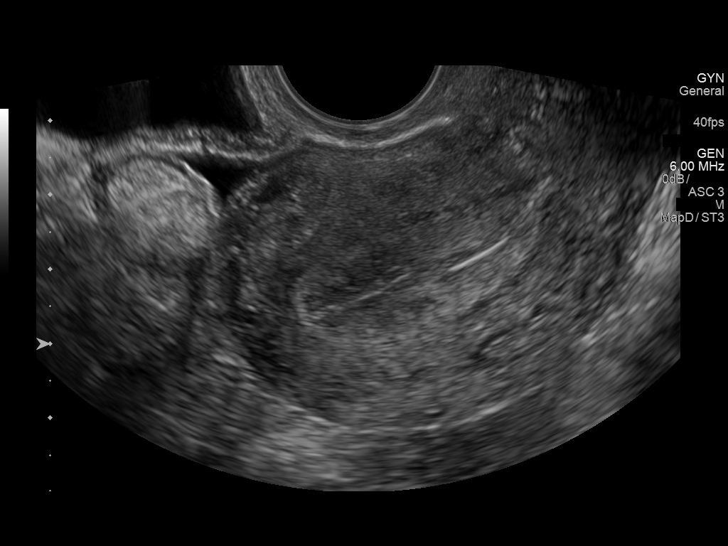

[13 of 25 positions shown; findings below may reference images not displayed]

FINDINGS: Uterus

Measurements: 8.1 x 4.7 x 5.4 cm. No fibroids.

Endometrium

Thickness: 16.3 mm.  No focal abnormality visualized.

Right ovary

Measurements: 4.0 x 2.8 x 3.5 cm. 2.1 cm hypoechoic lesion, possibly
a hemorrhagic cyst noted. Questionable adjacent 1.6 cm hypoechoic
region is noted. Small follicular cysts.

Left ovary

Measurements: 3.4 x 1.2 x 2.6 cm. Normal appearance/no adnexal mass.
Small follicular cysts.

Other findings

No abnormal free fluid.
IMPRESSION: 2.1 cm hypoechoic lesion, possibly hemorrhagic cyst noted in the
left ovary. Questionable adjacent 1.6 cm hypoechoic region is noted.

Short-interval follow up ultrasound in 6-12 weeks is recommended,
preferably during the week following the patient's normal menses.
Pregnancy test is suggested to exclude ectopic pregnancy.

## 2017-11-29 IMAGING — CR DG CHEST 2V
2 series · 2 of 2 positions shown · non-contrast
Comparison: None.

CLINICAL DATA: Nonproductive cough and fever for 5 days, nausea and
vomiting for 4 days, diarrhea for 2 days.

EXAM:
CHEST  2 VIEW

[chest pa]
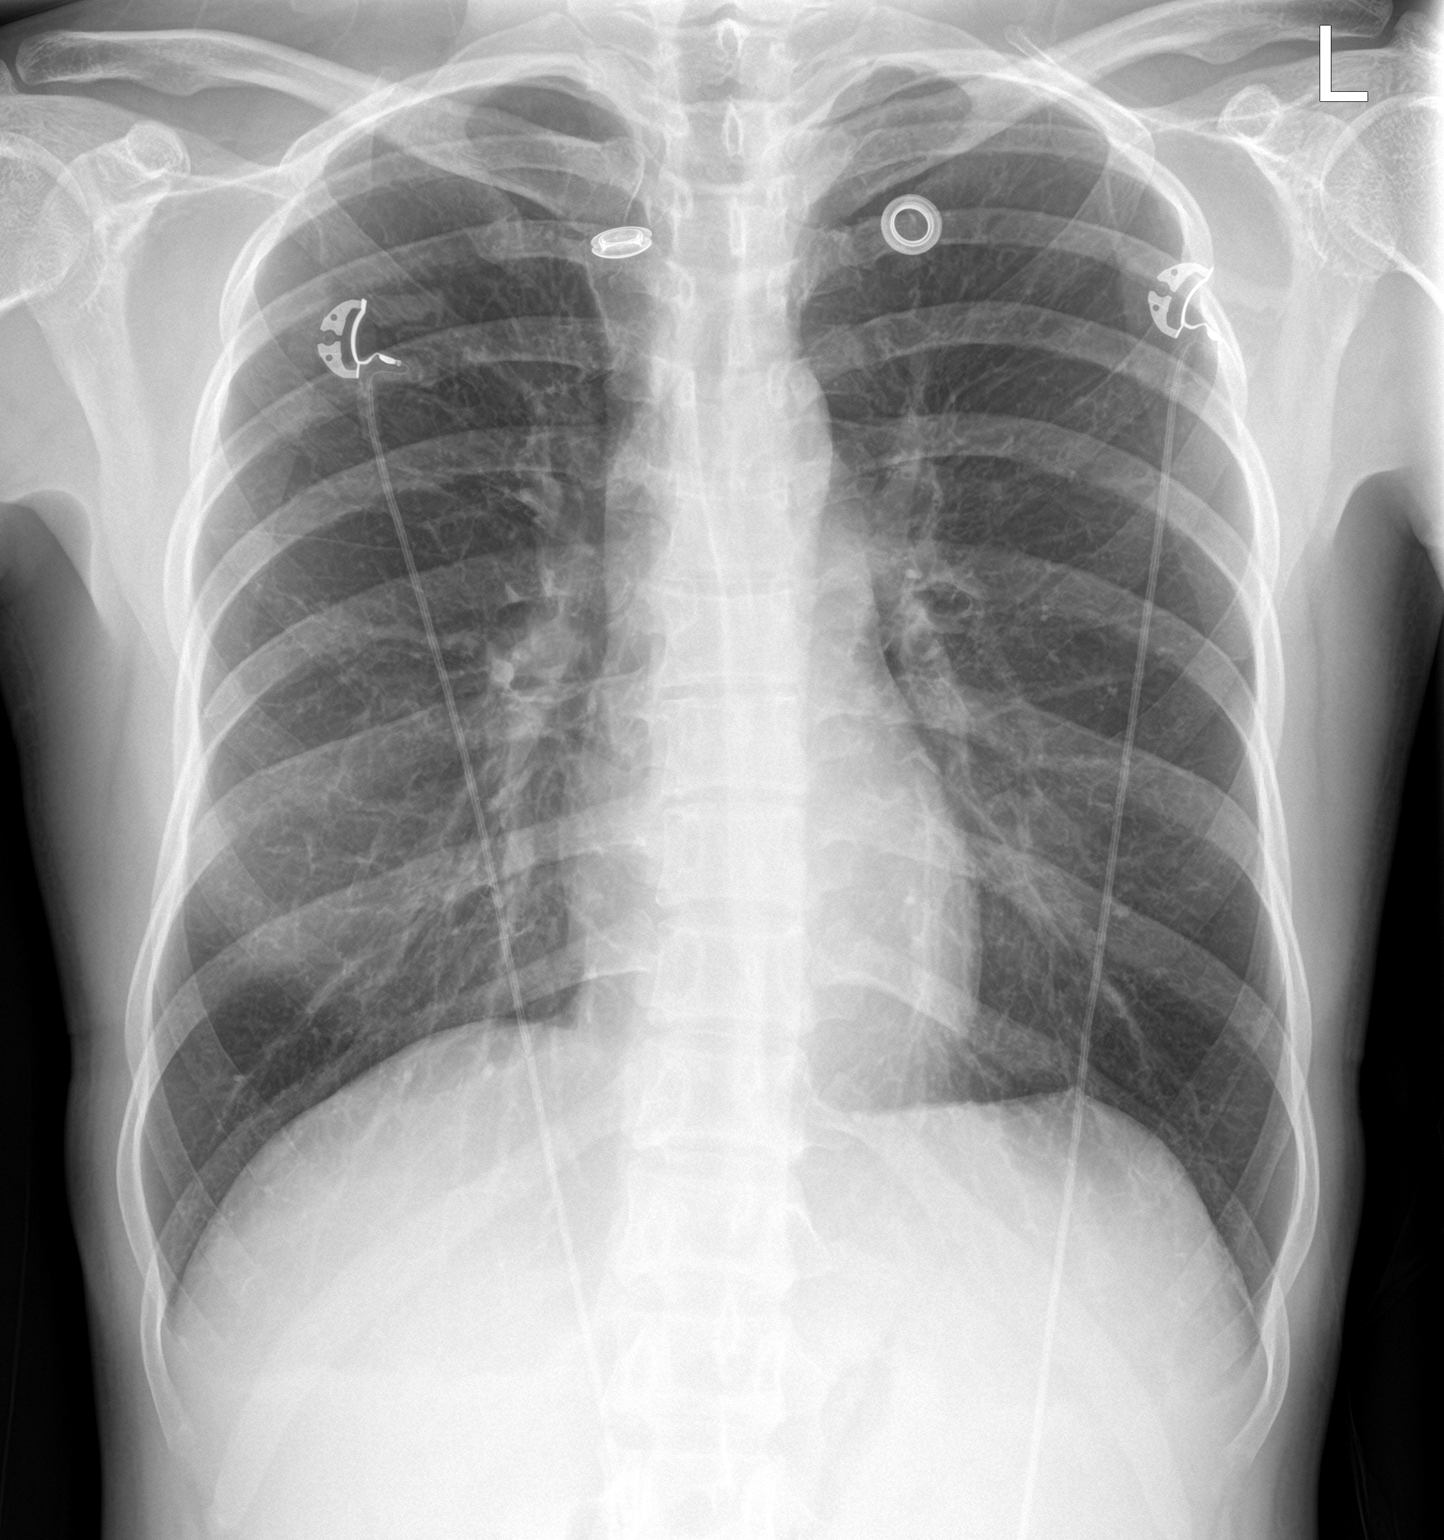

[chest lat]
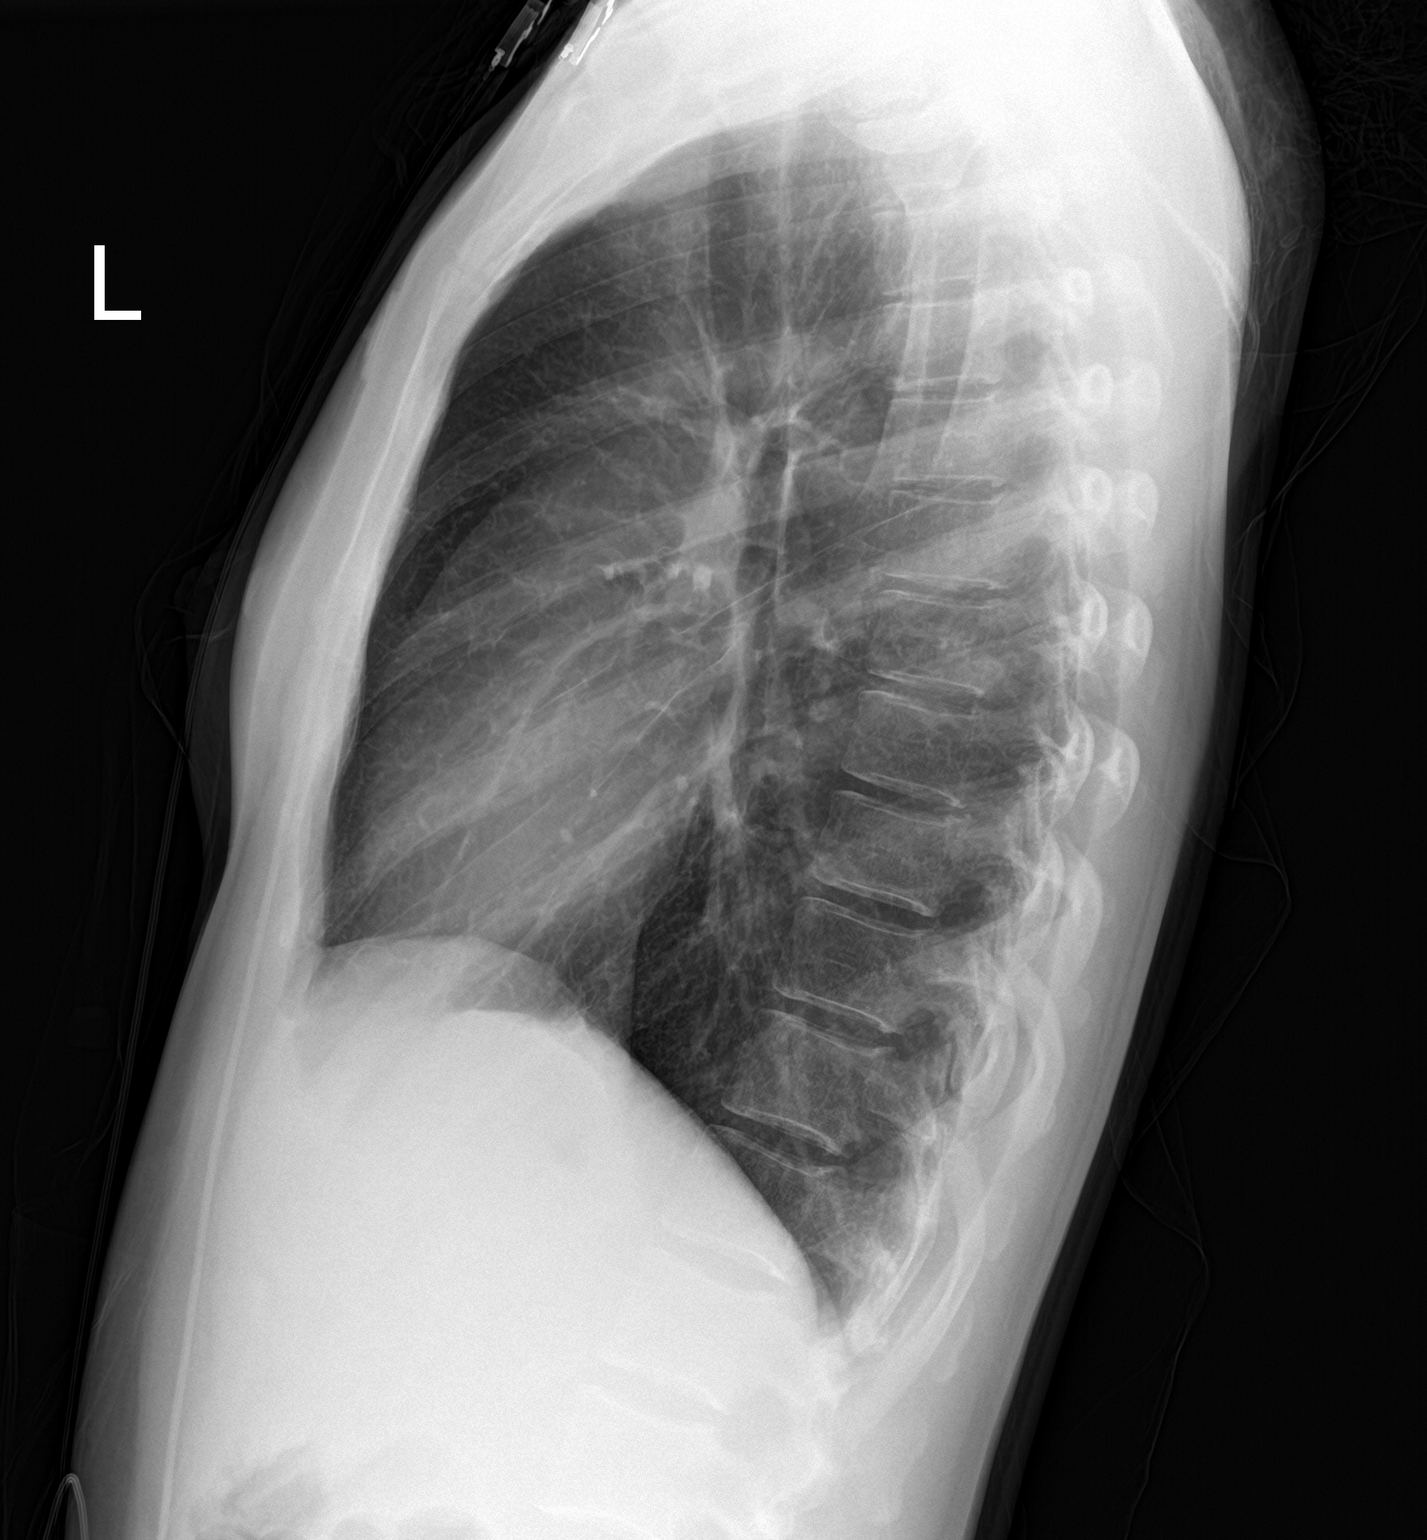

[2 of 2 positions shown; findings below may reference images not displayed]

FINDINGS: The heart size and mediastinal contours are within normal limits.
Both lungs are clear. The visualized skeletal structures are
unremarkable.
IMPRESSION: No active cardiopulmonary disease.

## 2018-04-10 IMAGING — US US ABDOMEN COMPLETE
1 series · 14 of 25 positions shown · non-contrast
Comparison: None.

CLINICAL DATA: Dehydration elevated LFTs

EXAM:
ABDOMEN ULTRASOUND COMPLETE

[Series 1: us abdomen complete · 0.18mm/px · 14 of 79 slices shown]
[im 1/79]
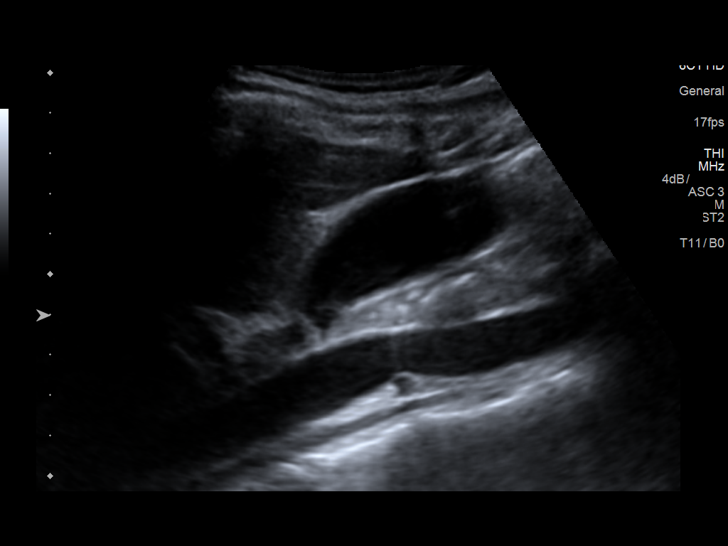
[im 7/79]
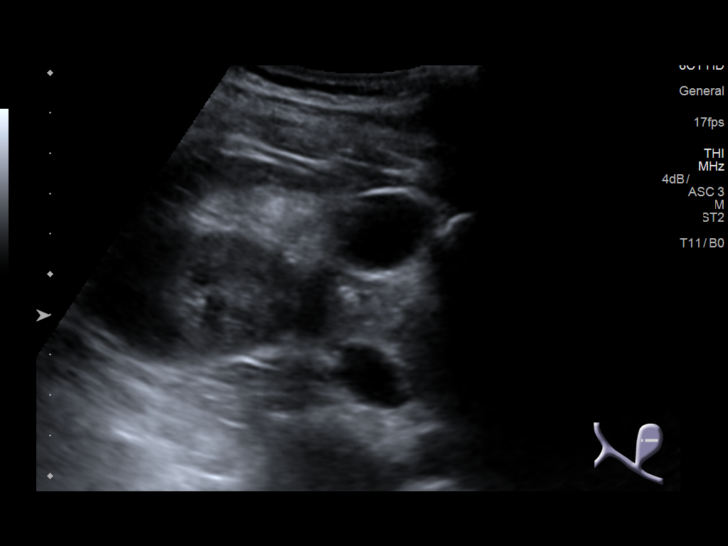
[im 14/79]
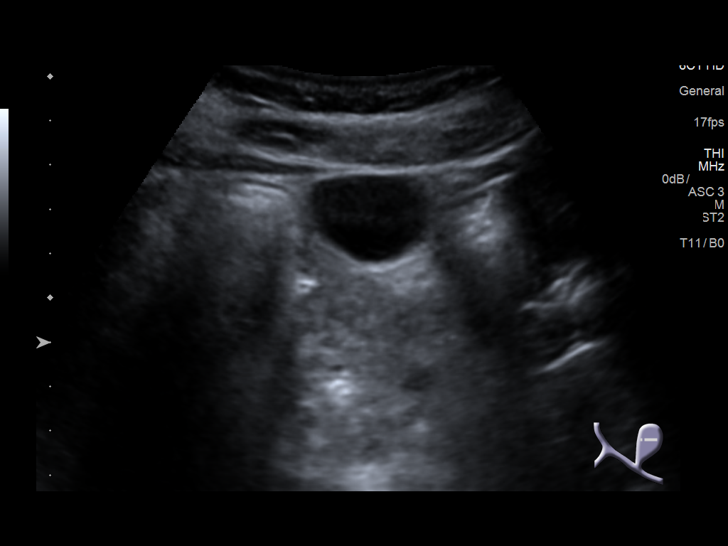
[im 20/79]
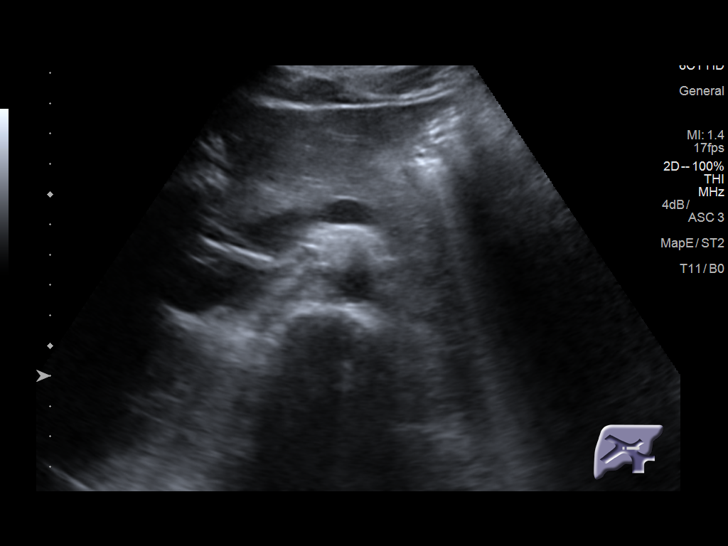
[im 27/79]
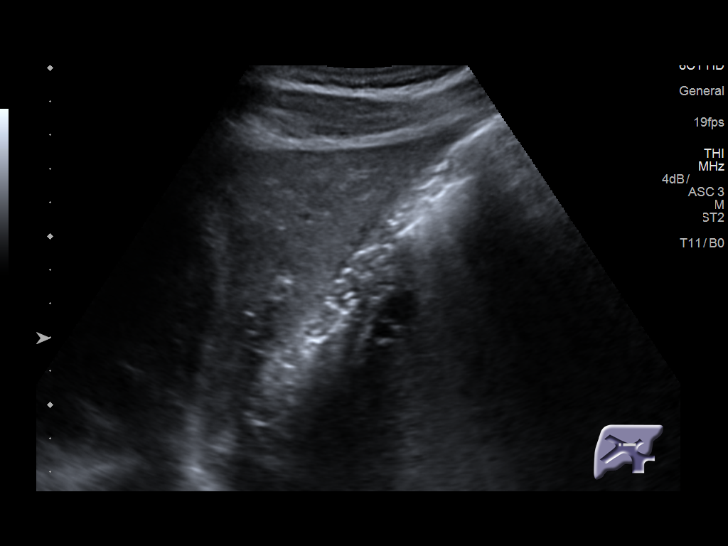
[im 30/79]
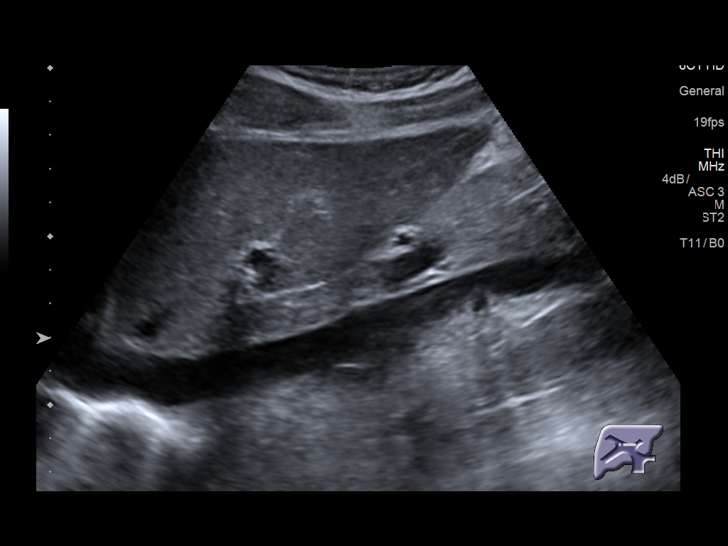
[im 36/79]
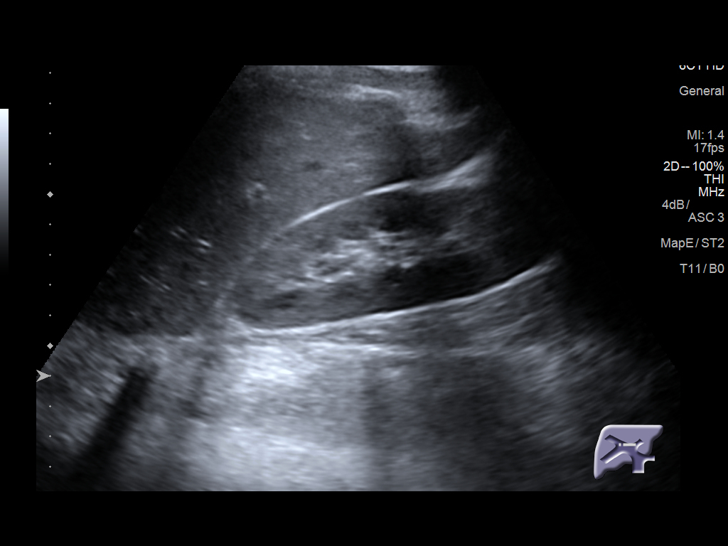
[im 43/79]
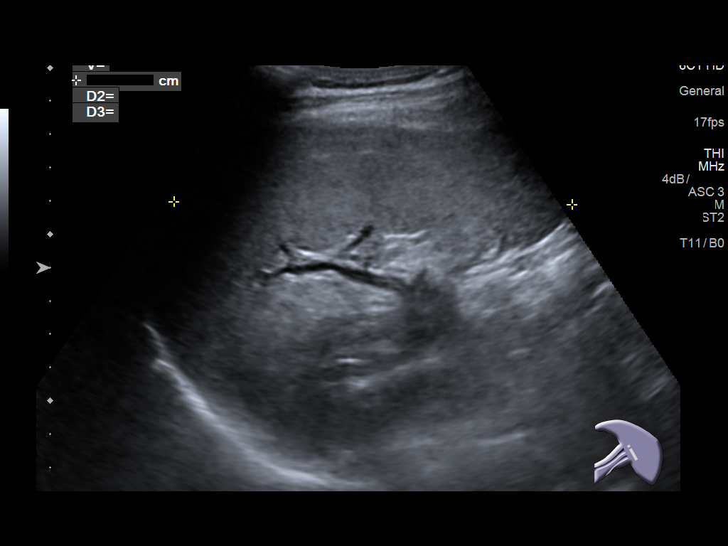
[im 49/79]
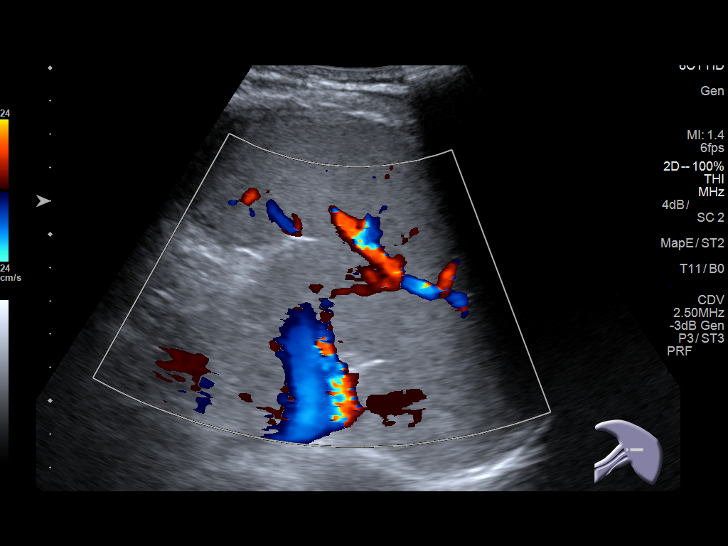
[im 53/79]
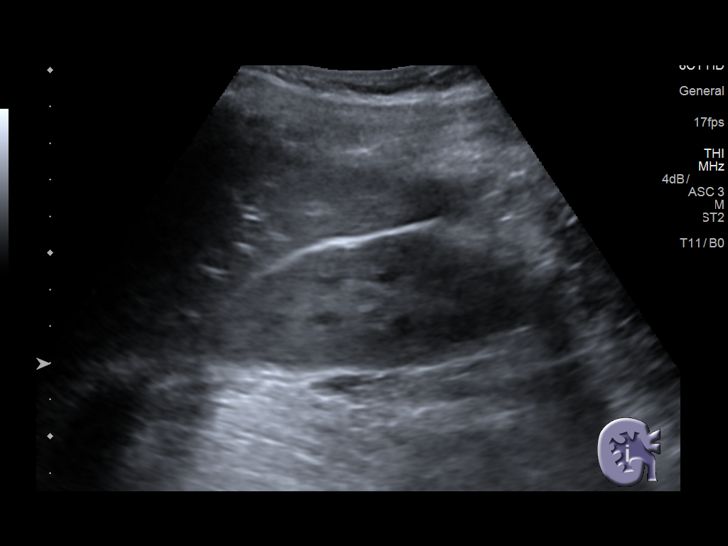
[im 59/79]
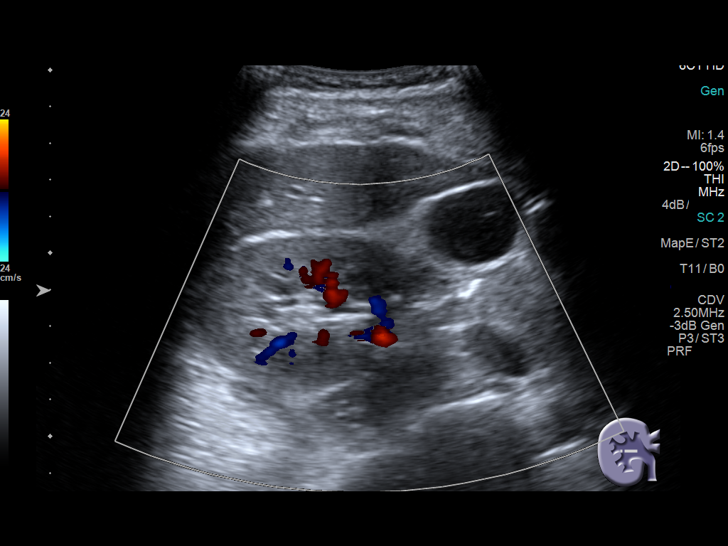
[im 66/79]
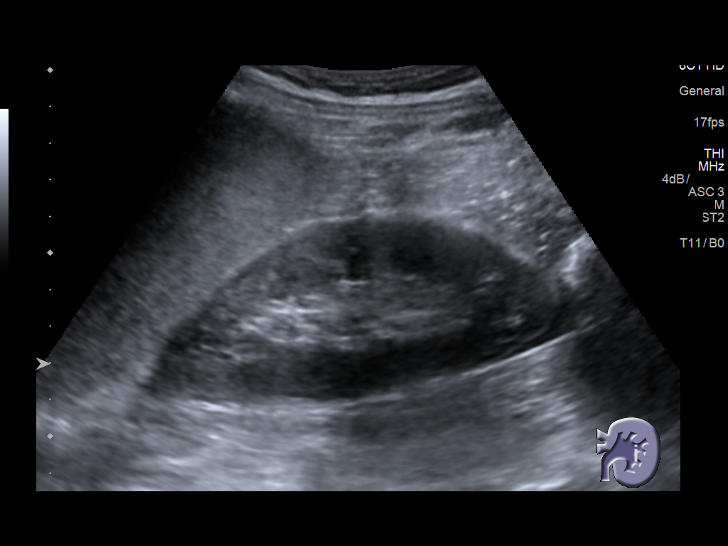
[im 72/79]
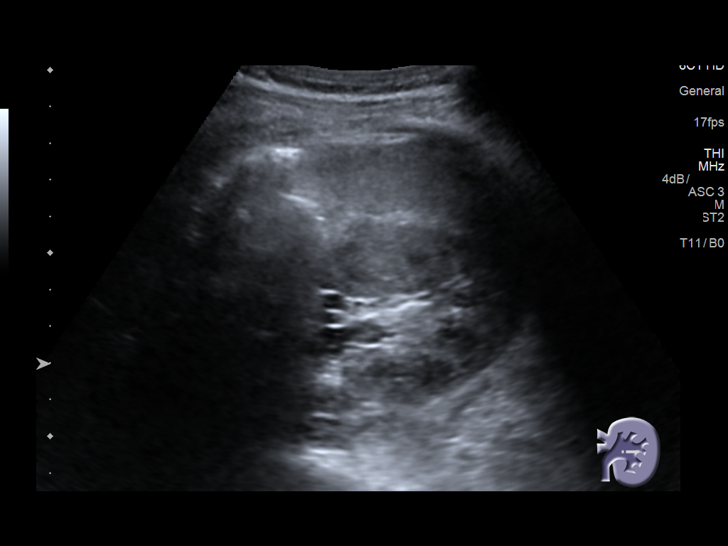
[im 79/79]
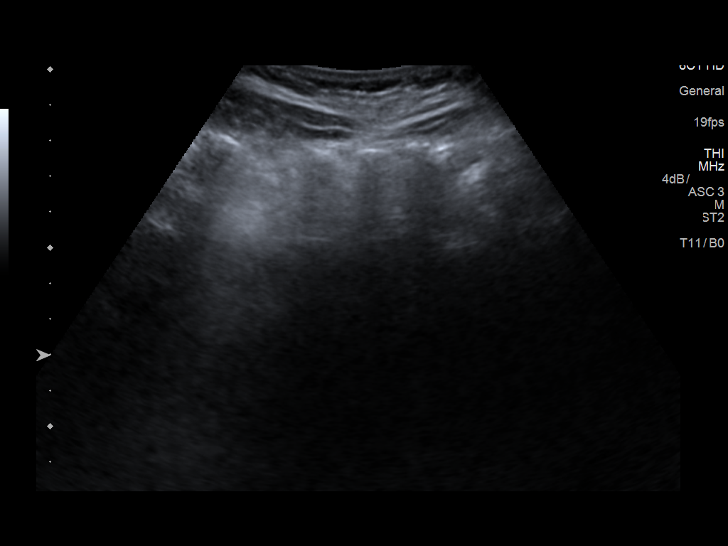

[14 of 25 positions shown; findings below may reference images not displayed]

FINDINGS: Gallbladder: No gallstones or wall thickening visualized. No
sonographic Murphy sign noted by sonographer.

Common bile duct: Diameter: 4.6 mm.

Liver: No focal lesion identified. Within normal limits in
parenchymal echogenicity.

IVC: No abnormality visualized.

Pancreas: Visualized portion unremarkable.

Spleen: Size and appearance within normal limits.

Right Kidney: Length: 10.7 cm.. Echogenicity within normal limits.
No mass or hydronephrosis visualized.

Left Kidney: Length: 11.5 cm.. Echogenicity within normal limits. No
mass or hydronephrosis visualized.

Abdominal aorta: No aneurysm visualized.

Other findings: None.
IMPRESSION: No acute abnormality noted.
